# Patient Record
Sex: Female | Born: 1965 | Race: White | Hispanic: No | Marital: Married | Smoking: Never smoker
Health system: Southern US, Community
[De-identification: ages and names within clinical notes are randomized; demographics above are authoritative.]

## PROBLEM LIST (undated history)

## (undated) DIAGNOSIS — E669 Obesity, unspecified: Secondary | ICD-10-CM

## (undated) DIAGNOSIS — G039 Meningitis, unspecified: Secondary | ICD-10-CM

## (undated) DIAGNOSIS — I10 Essential (primary) hypertension: Secondary | ICD-10-CM

## (undated) DIAGNOSIS — R51 Headache: Secondary | ICD-10-CM

## (undated) DIAGNOSIS — E119 Type 2 diabetes mellitus without complications: Secondary | ICD-10-CM

## (undated) HISTORY — DX: Headache: R51

## (undated) HISTORY — DX: Obesity, unspecified: E66.9

---
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
## 1997-08-26 ENCOUNTER — Other Ambulatory Visit: Admission: RE | Admit: 1997-08-26 | Discharge: 1997-08-26 | Payer: Self-pay | Admitting: Obstetrics and Gynecology

## 1997-09-09 ENCOUNTER — Other Ambulatory Visit: Admission: RE | Admit: 1997-09-09 | Discharge: 1997-09-09 | Payer: Self-pay | Admitting: Obstetrics and Gynecology

## 1998-03-20 ENCOUNTER — Inpatient Hospital Stay (HOSPITAL_COMMUNITY): Admission: EM | Admit: 1998-03-20 | Discharge: 1998-03-28 | Payer: Self-pay | Admitting: Emergency Medicine

## 1998-03-25 ENCOUNTER — Encounter: Payer: Self-pay | Admitting: Family Medicine

## 1998-04-14 ENCOUNTER — Other Ambulatory Visit: Admission: RE | Admit: 1998-04-14 | Discharge: 1998-04-14 | Payer: Self-pay | Admitting: Obstetrics and Gynecology

## 1998-06-01 ENCOUNTER — Other Ambulatory Visit: Admission: RE | Admit: 1998-06-01 | Discharge: 1998-06-01 | Payer: Self-pay | Admitting: Obstetrics and Gynecology

## 1998-06-28 ENCOUNTER — Other Ambulatory Visit: Admission: RE | Admit: 1998-06-28 | Discharge: 1998-06-28 | Payer: Self-pay | Admitting: Obstetrics and Gynecology

## 1998-11-09 ENCOUNTER — Other Ambulatory Visit: Admission: RE | Admit: 1998-11-09 | Discharge: 1998-11-09 | Payer: Self-pay | Admitting: Obstetrics and Gynecology

## 2005-01-23 ENCOUNTER — Other Ambulatory Visit: Admission: RE | Admit: 2005-01-23 | Discharge: 2005-01-23 | Payer: Self-pay | Admitting: Obstetrics and Gynecology

## 2005-05-28 HISTORY — PX: ABDOMINAL HYSTERECTOMY: SHX81

## 2005-07-26 ENCOUNTER — Inpatient Hospital Stay (HOSPITAL_COMMUNITY): Admission: RE | Admit: 2005-07-26 | Discharge: 2005-07-29 | Payer: Self-pay | Admitting: Obstetrics and Gynecology

## 2005-09-26 ENCOUNTER — Encounter: Payer: Self-pay | Admitting: Emergency Medicine

## 2012-08-29 ENCOUNTER — Other Ambulatory Visit: Payer: Self-pay | Admitting: Internal Medicine

## 2012-08-29 DIAGNOSIS — R42 Dizziness and giddiness: Secondary | ICD-10-CM

## 2012-09-01 ENCOUNTER — Encounter (HOSPITAL_COMMUNITY): Payer: Self-pay | Admitting: *Deleted

## 2012-09-01 ENCOUNTER — Emergency Department (HOSPITAL_COMMUNITY)
Admission: EM | Admit: 2012-09-01 | Discharge: 2012-09-01 | Disposition: A | Discharge status: Home / Self Care | Payer: Private Health Insurance - Indemnity | Attending: Emergency Medicine | Admitting: Emergency Medicine

## 2012-09-01 ENCOUNTER — Ambulatory Visit
Admission: RE | Admit: 2012-09-01 | Discharge: 2012-09-01 | Disposition: A | Discharge status: Home / Self Care | Payer: Private Health Insurance - Indemnity | Source: Ambulatory Visit | Attending: Internal Medicine | Admitting: Internal Medicine

## 2012-09-01 DIAGNOSIS — I10 Essential (primary) hypertension: Secondary | ICD-10-CM | POA: Insufficient documentation

## 2012-09-01 DIAGNOSIS — K5900 Constipation, unspecified: Secondary | ICD-10-CM | POA: Insufficient documentation

## 2012-09-01 DIAGNOSIS — Z90710 Acquired absence of both cervix and uterus: Secondary | ICD-10-CM | POA: Insufficient documentation

## 2012-09-01 DIAGNOSIS — L299 Pruritus, unspecified: Secondary | ICD-10-CM | POA: Insufficient documentation

## 2012-09-01 DIAGNOSIS — R109 Unspecified abdominal pain: Secondary | ICD-10-CM | POA: Insufficient documentation

## 2012-09-01 DIAGNOSIS — E119 Type 2 diabetes mellitus without complications: Secondary | ICD-10-CM | POA: Insufficient documentation

## 2012-09-01 DIAGNOSIS — R110 Nausea: Secondary | ICD-10-CM | POA: Insufficient documentation

## 2012-09-01 DIAGNOSIS — R42 Dizziness and giddiness: Secondary | ICD-10-CM | POA: Insufficient documentation

## 2012-09-01 DIAGNOSIS — Z8661 Personal history of infections of the central nervous system: Secondary | ICD-10-CM | POA: Insufficient documentation

## 2012-09-01 DIAGNOSIS — Z79899 Other long term (current) drug therapy: Secondary | ICD-10-CM | POA: Insufficient documentation

## 2012-09-01 HISTORY — DX: Essential (primary) hypertension: I10

## 2012-09-01 HISTORY — DX: Type 2 diabetes mellitus without complications: E11.9

## 2012-09-01 HISTORY — DX: Meningitis, unspecified: G03.9

## 2012-09-01 LAB — COMPREHENSIVE METABOLIC PANEL
Albumin: 4.1 g/dL (ref 3.5–5.2)
Alkaline Phosphatase: 61 U/L (ref 39–117)
BUN: 11 mg/dL (ref 6–23)
Calcium: 9.6 mg/dL (ref 8.4–10.5)
Potassium: 4 mEq/L (ref 3.5–5.1)
Sodium: 142 mEq/L (ref 135–145)
Total Protein: 7.4 g/dL (ref 6.0–8.3)

## 2012-09-01 LAB — CBC
HCT: 45.2 % (ref 36.0–46.0)
MCHC: 35.4 g/dL (ref 30.0–36.0)
RDW: 14 % (ref 11.5–15.5)

## 2012-09-01 MED ORDER — SENNOSIDES 8.6 MG PO TABS: 2.0000 | ORAL_TABLET | Freq: Every day | ORAL | Status: DC

## 2012-09-01 MED ORDER — MECLIZINE HCL 25 MG PO TABS
25.0000 mg | ORAL_TABLET | Freq: Once | ORAL | Status: DC
  Filled 2012-09-01: qty 1

## 2012-09-01 NOTE — ED Provider Notes (Signed)
ECG shows normal sinus rhythm with a rate of 74, no ectopy. Normal axis. Normal P wave. Normal QRS. Normal intervals. Normal ST and T waves. Impression: normal ECG. No prior ECG available for comparison.  Medical screening examination/treatment/procedure(s) were performed by non-physician practitioner and as supervising physician I was immediately available for consultation/collaboration.   Dione Booze, MD 09/01/12 [phone]
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
## 2012-09-01 NOTE — ED Notes (Addendum)
Pt states that she is here because her pupils keep contracting and dilating and "pulsating". Pt states that she has a pain in her upper abdomen and it goes through to her back. Pt states dizziness as well. Pt very anxious and tearful. Pt suppose to have MRI of head and kidneys today due to dizziness by PCP.

## 2012-09-05 ENCOUNTER — Emergency Department: Payer: Self-pay | Admitting: Emergency Medicine

## 2012-09-05 LAB — CBC
HCT: 47.7 % — ABNORMAL HIGH (ref 35.0–47.0)
MCV: 84 fL (ref 80–100)
RBC: 5.69 10*6/uL — ABNORMAL HIGH (ref 3.80–5.20)
RDW: 14.3 % (ref 11.5–14.5)

## 2012-09-05 LAB — COMPREHENSIVE METABOLIC PANEL
Albumin: 4.2 g/dL (ref 3.4–5.0)
Alkaline Phosphatase: 69 U/L (ref 50–136)
Anion Gap: 8 (ref 7–16)
Bilirubin,Total: 0.6 mg/dL (ref 0.2–1.0)
Co2: 27 mmol/L (ref 21–32)
EGFR (African American): >60
EGFR (Non-African Amer.): >60
Sodium: 140 mmol/L (ref 136–145)
Total Protein: 7.7 g/dL (ref 6.4–8.2)

## 2012-09-05 LAB — URINALYSIS, COMPLETE
Bilirubin,UR: NEGATIVE
Glucose,UR: NEGATIVE mg/dL (ref 0–75)
Leukocyte Esterase: NEGATIVE
Nitrite: NEGATIVE
Ph: 5 (ref 4.5–8.0)
Specific Gravity: 1.014 (ref 1.003–1.030)
Squamous Epithelial: 1

## 2012-09-15 ENCOUNTER — Institutional Professional Consult (permissible substitution): Payer: Private Health Insurance - Indemnity | Admitting: Internal Medicine

## 2012-09-16 ENCOUNTER — Institutional Professional Consult (permissible substitution): Payer: Private Health Insurance - Indemnity | Admitting: Internal Medicine

## 2012-09-18 ENCOUNTER — Encounter: Payer: Self-pay | Admitting: Neurology

## 2012-09-18 ENCOUNTER — Ambulatory Visit (INDEPENDENT_AMBULATORY_CARE_PROVIDER_SITE_OTHER): Payer: Private Health Insurance - Indemnity | Admitting: Neurology

## 2012-09-18 VITALS — BP 155/93 | HR 76 | Ht 65.25 in | Wt 263.0 lb

## 2012-09-18 DIAGNOSIS — G43019 Migraine without aura, intractable, without status migrainosus: Secondary | ICD-10-CM | POA: Insufficient documentation

## 2012-09-18 MED ORDER — SUMATRIPTAN SUCCINATE 100 MG PO TABS: 100.0000 mg | ORAL_TABLET | Freq: Two times a day (BID) | ORAL | Status: DC | PRN

## 2012-09-18 MED ORDER — TOPIRAMATE 25 MG PO TABS: ORAL_TABLET | ORAL | Status: DC

## 2012-09-18 NOTE — Progress Notes (Signed)
Reason for visit: Headache  Patricia Schmitt is a 47 y.o. female  History of present illness:  Patricia Schmitt is a 47 year old right-handed white female with a history of obesity and a prior history of migraine headache. The patient generally will have headaches twice a month, but within the last 6 weeks, the patient had unusual episodes associated with headache. The patient will have a sensation of flushing coming up from the low back to the back of the neck and over the head. The patient will then developed a throbbing headache in the back of the head, associated with some neck stiffness. The patient will have dizziness, vertigo, and oscillopsia. The patient may have some nausea with the events, no vomiting. The patient will develop photophobia and phonophobia, and she will see spots in front of the eyes with blurred vision. The patient indicates that the episodes may last anywhere from 20 minutes to several hours. The patient may be wiped out afterwards. The patient has also developed constipation which is unusual for her. The patient has undergone MRI evaluation the brain that is normal. The patient denies any problems controlling the bladder, and she denies any blackouts, or any weakness of the extremities. The patient may have some tingling sensations in the toes and down the arms. The patient is sent to this office for an evaluation.  Past Medical History  Diagnosis Date  . Meningitis spinal   . Hypertension   . Headache     Migraine  . Obesity   . Diabetes mellitus without complication     Prediabetes    Past Surgical History  Procedure Laterality Date  . Abdominal hysterectomy    . Tonsillectomy      History reviewed. No pertinent family history.  Social history:  reports that she has never smoked. She does not have any smokeless tobacco history on file. She reports that  drinks alcohol. She reports that she does not use illicit drugs.  Medications:  Current Outpatient  Prescriptions on File Prior to Visit  Medication Sig Dispense Refill  . glycerin adult (GLYCERIN ADULT) 2 G SUPP Place 1 suppository rectally once.      . senna (SENOKOT TO GO) 8.6 MG tablet Take 2 tablets (17.2 mg total) by mouth daily.  15 tablet  1   No current facility-administered medications on file prior to visit.    Allergies:  Allergies  Allergen Reactions  . Ace Inhibitors Other (See Comments)    Dizziness, balance issues  . Amoxicillin Other (See Comments)    Stomach cramps  . Erythromycin     Stomach cramps  . Ivp Dye (Iodinated Diagnostic Agents)     Nausea vomiting  . Penicillins Nausea And Vomiting    ROS:  Out of a complete 14 system review of symptoms, the patient complains only of the following symptoms, and all other reviewed systems are negative.  Fevers, chills, fatigue Ringing in the ears, vertigo Itching sensations Blurred vision, shortness of breath, cough Constipation Easy bruising Feeling cold, flushing Joint pain, achy muscles Memory disturbance, confusion, headache, numbness Tremor, anxiety, change in appetite, insomnia  Blood pressure 155/93, pulse 76, height 5' 5.25" (1.657 m), weight 263 lb (119.296 kg).  Physical Exam  General: The patient is alert and cooperative at the time of the examination. The patient is markedly obese.  Head: Pupils are equal, round, and reactive to light. Discs are flat bilaterally.  Neck: The neck is supple, no carotid bruits are noted.  Respiratory: The respiratory  examination is clear.  Cardiovascular: The cardiovascular examination reveals a regular rate and rhythm, no obvious murmurs or rubs are noted.  Neuromuscular: The patient has good range of movement of the cervical spine. No crepitus is noted in the temporomandibular joints.  Skin: Extremities are without significant edema.  Neurologic Exam  Mental status:  Cranial nerves: Facial symmetry is present. There is good sensation of the face to  pinprick and soft touch bilaterally. The strength of the facial muscles and the muscles to head turning and shoulder shrug are normal bilaterally. Speech is well enunciated, no aphasia or dysarthria is noted. Extraocular movements are full. Visual fields are full.  Motor: The motor testing reveals 5 over 5 strength of all 4 extremities. Good symmetric motor tone is noted throughout.  Sensory: Sensory testing is intact to pinprick, soft touch, vibration sensation, and position sense on all 4 extremities. No evidence of extinction is noted.  Coordination: Cerebellar testing reveals good finger-nose-finger and heel-to-shin bilaterally.  Gait and station: Gait is normal. Tandem gait is normal. Romberg is negative. No drift is seen.  Reflexes: Deep tendon reflexes are symmetric and normal bilaterally. Toes are downgoing bilaterally.   Assessment/Plan:  1. Episodic headache, dizziness, tingling sensations  The patient likely has migraine-type phenomenon. The patient has several characteristic features of migraine associated with throbbing headache, photophobia, phonophobia, scalp tenderness, vertigo, and nausea associated with headache and neck stiffness. The patient had a MRI the brain that was unremarkable. The patient will be placed on Topamax, and she will be worked up to a 75 mg at night dose. The patient was given Imitrex to take if needed. The patient will followup through this office in about 3 months.  Marlan Palau MD 09/18/2012 8:14 PM  [facility] [address] [place], [place] [address]  Phone [phone] Fax [phone]

## 2012-09-22 ENCOUNTER — Ambulatory Visit: Payer: Private Health Insurance - Indemnity | Admitting: Neurology

## 2012-09-29 ENCOUNTER — Encounter: Payer: Self-pay | Admitting: Internal Medicine

## 2012-09-29 ENCOUNTER — Ambulatory Visit (INDEPENDENT_AMBULATORY_CARE_PROVIDER_SITE_OTHER): Payer: Private Health Insurance - Indemnity | Admitting: Internal Medicine

## 2012-09-29 VITALS — BP 130/90 | HR 86 | Temp 98.0°F | Ht 65.0 in | Wt 259.8 lb

## 2012-09-29 DIAGNOSIS — R0600 Dyspnea, unspecified: Secondary | ICD-10-CM

## 2012-09-29 DIAGNOSIS — R0609 Other forms of dyspnea: Secondary | ICD-10-CM

## 2012-09-29 DIAGNOSIS — R109 Unspecified abdominal pain: Secondary | ICD-10-CM | POA: Insufficient documentation

## 2012-09-29 NOTE — Progress Notes (Signed)
  Subjective:    Patient ID: Patricia Schmitt, female    DOB: 02-12-1966   MRN: [id number]  HPI  64 yowf never smoker works in [employer] with progressive wt gain starting around 2000 with hbp and aodm self referred for new onset "spells"   09/29/2012 1st pulmonary eval cc new onset waking up feeling chilled nervous tachycardia and ? [place] with bs 124 fasting on glucophage and nauseated all resolved w/in an hour without eats 6 weeks prior to OV  Recurred sev times a day then went off glucophage 4 weeks prior to OV  Then to ER with same symptoms which have reduced some in frequency and feels better after labs drawn then 3 weeks prior to OV separate from the above sob at rest, lasts up to 15 min and better after walking and deep breathing.  No effect on activity tolerance but not aerobically active and never occur sleeping.   No obvious daytime variabilty or assoc chronic cough or cp or chest tightness, subjective wheeze overt sinus or hb symptoms. No unusual exp hx or h/o childhood pna/ asthma or premature birth to her knowledge.   Sleeping ok without nocturnal  or early am exacerbation  of respiratory  c/o's or need for noct saba. Also denies any obvious fluctuation of symptoms with weather or environmental changes or other aggravating or alleviating factors except as outlined above   Review of Systems  Constitutional: Negative for fever, chills and unexpected weight change.  HENT: Negative for ear pain, nosebleeds, congestion, sore throat, rhinorrhea, sneezing, trouble swallowing, dental problem, voice change, postnasal drip and sinus pressure.   Eyes: Negative for visual disturbance.  Respiratory: Positive for shortness of breath. Negative for cough and choking.   Cardiovascular: Negative for chest pain and leg swelling.  Gastrointestinal: Positive for abdominal pain. Negative for vomiting and diarrhea.  Genitourinary: Negative for difficulty urinating.  Musculoskeletal: Negative  for arthralgias.  Skin: Negative for rash.  Neurological: Positive for headaches. Negative for tremors and syncope.  Hematological: Does not bruise/bleed easily.       Objective:   Physical Exam  amb ant wf  Wt Readings from Last 3 Encounters:  09/29/12 259 lb 12.8 oz (117.845 kg)  09/18/12 263 lb (119.296 kg)   HEENT: nl dentition, turbinates, and orophanx. Nl external ear canals without cough reflex   NECK :  without JVD/Nodes/TM/ nl carotid upstrokes bilaterally   LUNGS: no acc muscle use, clear to A and P bilaterally without cough on insp or exp maneuvers   CV:  RRR  no s3 or murmur or increase in P2, no edema   ABD:  soft and nontender with nl excursion in the supine position. No bruits or organomegaly, bowel sounds nl  MS:  warm without deformities, calf tenderness, cyanosis or clubbing  SKIN: warm and dry without lesions    NEURO:  alert, approp, no deficits       Assessment & Plan:

## 2012-09-29 NOTE — Patient Instructions (Addendum)
Classic subdiaphragmatic pain pattern suggests ibs:  Stereotypical, migratory with a very limited distribution of pain locations, daytime, not exacerbated by ex or coughing, worse in sitting position, associated with generalized abd bloating, not present supine due to the dome effect of the diaphragm is  canceled in that position. Frequently these patients have had multiple negative GI workups and CT scans.  Treatment consists of avoiding foods that cause gas (especially beans and raw vegetables like spinach and salads)  and citrucel 1 heaping tsp twice daily with a large glass of water.  Pain should improve w/in 2 weeks and if not then consider further GI work up.     Maalox plus or Mylanta II are also effective as needed  The follow up on your nodule is one year optional.

## 2012-09-30 ENCOUNTER — Telehealth: Payer: Self-pay | Admitting: Internal Medicine

## 2012-09-30 ENCOUNTER — Telehealth: Payer: Self-pay | Admitting: Gastroenterology

## 2012-09-30 DIAGNOSIS — R0600 Dyspnea, unspecified: Secondary | ICD-10-CM | POA: Insufficient documentation

## 2012-09-30 DIAGNOSIS — R109 Unspecified abdominal pain: Secondary | ICD-10-CM

## 2012-09-30 NOTE — Telephone Encounter (Signed)
Almyra Free is given an appt for the patient for tomorrow with Mike Gip PA

## 2012-09-30 NOTE — Telephone Encounter (Signed)
I have left a message for [facility]

## 2012-09-30 NOTE — Assessment & Plan Note (Signed)
Symptoms are markedly disproportionate to objective findings and not clear this is a lung problem  - the pattern is typical of hyperventilation syndrome.  Reassured if the symptoms are not reproducible with exertion and resolve spontaneously assoc with abd complaints it may well be she senses abd gas which reduces abd compliance noting it also never happens in supine position where intestinal gas buildup is not as likely.

## 2012-09-30 NOTE — Telephone Encounter (Signed)
Order placed patient is aware. Patient states she did try Maalox Plus--however this did not work. Nothing further needed at this time

## 2012-09-30 NOTE — Telephone Encounter (Signed)
Spoke with patient, Patient was seen by Dr. Sherene Sires 09/29/12-- She states she did not sleep well last night-- sob, upper abd pain that radiated to back Today she still feels the same, sx accompanied with more back soreness when eating, nausea,dixxiness and tingling in her hands, feet and arms. Patient is requesting GI referral now, states she can not wait 2 weeks Dr. Sherene Sires please advise if this is ok to go ahead and order. Thank you

## 2012-09-30 NOTE — Assessment & Plan Note (Signed)

## 2012-09-30 NOTE — Telephone Encounter (Signed)
Fine me  Dx abd pain Make sure she tries the prn maalox plus first

## 2012-10-01 ENCOUNTER — Ambulatory Visit (INDEPENDENT_AMBULATORY_CARE_PROVIDER_SITE_OTHER): Payer: Private Health Insurance - Indemnity | Admitting: Physician Assistant

## 2012-10-01 ENCOUNTER — Ambulatory Visit (HOSPITAL_COMMUNITY)
Admission: RE | Admit: 2012-10-01 | Discharge: 2012-10-01 | Disposition: A | Discharge status: Home / Self Care | Payer: Private Health Insurance - Indemnity | Source: Ambulatory Visit | Attending: Physician Assistant | Admitting: Physician Assistant

## 2012-10-01 ENCOUNTER — Other Ambulatory Visit: Payer: Private Health Insurance - Indemnity

## 2012-10-01 ENCOUNTER — Encounter: Payer: Self-pay | Admitting: Physician Assistant

## 2012-10-01 ENCOUNTER — Other Ambulatory Visit (INDEPENDENT_AMBULATORY_CARE_PROVIDER_SITE_OTHER): Payer: Private Health Insurance - Indemnity

## 2012-10-01 VITALS — BP 138/90 | HR 78 | Ht 64.0 in | Wt 256.0 lb

## 2012-10-01 DIAGNOSIS — G8929 Other chronic pain: Secondary | ICD-10-CM

## 2012-10-01 DIAGNOSIS — R198 Other specified symptoms and signs involving the digestive system and abdomen: Secondary | ICD-10-CM

## 2012-10-01 DIAGNOSIS — K5900 Constipation, unspecified: Secondary | ICD-10-CM

## 2012-10-01 DIAGNOSIS — R194 Change in bowel habit: Secondary | ICD-10-CM

## 2012-10-01 DIAGNOSIS — K921 Melena: Secondary | ICD-10-CM

## 2012-10-01 DIAGNOSIS — R1011 Right upper quadrant pain: Secondary | ICD-10-CM

## 2012-10-01 MED ORDER — OMEPRAZOLE 20 MG PO CPDR: DELAYED_RELEASE_CAPSULE | ORAL | Status: DC

## 2012-10-01 MED ORDER — MOVIPREP 100 G PO SOLR: 1.0000 | Freq: Once | ORAL | Status: DC

## 2012-10-01 NOTE — Progress Notes (Addendum)
Subjective:    Patient ID: Patricia Schmitt, female    DOB: 08-26-1965, 47 y.o.   MRN: [id number]  HPI  Patricia Schmitt is a pleasant 47 year old white female new to GI today. She has seen Dr. Sherene Sires for primary care and is referred for evaluation of abdominal pain. She has not had any prior GI evaluation She says she has been having her current symptoms for 6 or 7 weeks and complains of severe pain in her epigastrium radiating around bilaterally into her back. She describes a burning pain in her back and a sharp pain in her upper abdomen which is worse with any by mouth intake and lasts for one to one and a half hours .Marland Kitchen She is also been having intermittent nausea. She says she has no appetite but has been pushing herself to eat to keep her energy up. Her weight has been stable. She also has a sour bitter taste in her mouth and feels that her tongue is coated. She has also developed new onset of constipation which she has never had in the past and this has been present over the past few months. Says she's now going as long as 7 or 8 days between bowel movements. She has seen small amounts of bright red blood per and generally when she is constipated. She tried a bottle of mag citrate at 1 point which did help the constipation however made her fell portable overall. His not using any other regular laxatives. She had been taking metformin for prediabetes and Diovan and stopped both of these about 40 days ago because of onset of the above symptoms. She was not sure whether or not she was having side effects from these meds and stopped him but says that she does not feel any different. She's not using any regular aspirin or NSAIDs. Family history is positive for gallbladder disease in 2 sisters. Patient had a CT scan of the abdomen and pelvis done on 09/08/2012 without IV contrast and this showed a 4 mm nodule at the right lung base and a 2.2 cm hypodensity adjacent to the falciform ligament likely focal fatty infiltration  otherwise negative scan. She had also had a CBC with differential and seem that done in April both of which were normal. Her hemoglobin was actually high at 16. Patient is very concerned about her symptoms and states that she knows something is wrong in that she has never felt this poorly in the past.    Review of Systems  Constitutional: Positive for activity change, appetite change and fatigue.  HENT: Negative.   Eyes: Negative.   Respiratory: Negative.   Cardiovascular: Negative.   Gastrointestinal: Positive for nausea, abdominal pain, constipation and anal bleeding.  Endocrine: Negative.   Genitourinary: Negative.   Allergic/Immunologic: Negative.   Neurological: Negative.   Hematological: Negative.   Psychiatric/Behavioral: Negative.    No outpatient prescriptions prior to visit.   No facility-administered medications prior to visit.       Allergies  Allergen Reactions  . Ace Inhibitors Other (See Comments)    Dizziness, balance issues  . Amoxicillin Other (See Comments)    Stomach cramps  . Erythromycin     Stomach cramps  . Ivp Dye (Iodinated Diagnostic Agents)     Nausea vomiting  . Penicillins Nausea And Vomiting   Patient Active Problem List   Diagnosis Date Noted  . Dyspnea 09/30/2012  . Abdominal pain, unspecified site 09/29/2012  . Migraine without aura, with intractable migraine, so stated, without mention  of status migrainosus 09/18/2012   History  Substance Use Topics  . Smoking status: Never Smoker   . Smokeless tobacco: Never Used  . Alcohol Use: Yes     Comment: Drink 1-2 times per year for holidays    Objective:   Physical Exam  well-developed white female in no acute distress, pleasant blood pressure 138/90 pulse 78 height 5 foot 4 weight 256. HEENT; nontraumatic normocephalic EOMI PERRLA sclera anicteric, Supple; no JVD, Cardiovascular; regular rate and rhythm with S1-S2 no murmur or gallop, Pulmonary; clear bilaterally, Abdomen; large soft  bowel sounds are active she is tender in the epigastrium right upper quadrant and right mid quadrant there is no guarding or rebound no palpable mass or hepatosplenomegaly, Rectal; exam small external hemorrhoidal tag stool is yellow and Hemoccult negative, Extremities; no clubbing cyanosis or edema skin warm and dry, Psych; mood and affect appropriate.        Assessment & Plan:  #67 47 year old female with 6-7 week history of epigastric pain radiating to her back and new onset constipation. Patient has had associated fatigue anorexia and intermittent small-volume hematochezia. Etiology of her symptoms is not clear. We'll workup further to rule out gallbladder disease also consider gastropathy or peptic ulcer disease or underlying IBD.   Plan; start empiric Prilosec 20 mg by mouth every morning, samples were given today to see if this would help with the epigastric pain Start MiraLax 17 g daily in 8 ounces of water or juice Schedule for upper abdominal ultrasound Schedule for colonoscopy and EGD with Dr. Pyrtle-procedures were discussed in detail with the patient and she is agreeable to proceed. We'll check CRP and ANA today patient states she had recent thyroid studies done which were normal.  Addendum: Reviewed and agree with initial management. Beverley Fiedler, MD

## 2012-10-01 NOTE — Patient Instructions (Addendum)
Go directly to [facility] outpatient registration, front door, go to Radiology, 1st floor. Come to our lab basement level today for labs.  We sent the prescription for the prep for the colonoscopy to [facility], [place].  We have given you samples of Prilosec and Miralax. Take the Miralax 1 dose daily.   You can get that at any pharmacy, [facility], [facility], [facility]. You have been scheduled for an endoscopy and colonoscopy with propofol. Please follow the written instructions given to you at your visit today. Please pick up your prep at the pharmacy within the next 1-3 days. If you use inhalers (even only as needed), please bring them with you on the day of your procedure. Your physician has requested that you go to www.startemmi.com and enter the access code given to you at your visit today. This web site gives a general overview about your procedure. However, you should still follow specific instructions given to you by our office regarding your preparation for the procedure.

## 2012-10-02 ENCOUNTER — Telehealth: Payer: Self-pay | Admitting: Physician Assistant

## 2012-10-02 ENCOUNTER — Encounter: Payer: Self-pay | Admitting: Internal Medicine

## 2012-10-02 LAB — ANA: Anti Nuclear Antibody(ANA): NEGATIVE

## 2012-10-02 NOTE — Telephone Encounter (Signed)
Scheduled with Dr. Rhea Belton on 10/14/12 at 9:00 AM. She will call [facility] for appointment.

## 2012-10-02 NOTE — Telephone Encounter (Signed)
She needs to follow up with Pyrtle in a few weeks if cancelling procedures. Can be referred to [employer] [facility], probably can call and make her own appt

## 2012-10-02 NOTE — Telephone Encounter (Signed)
Patient wants to wait and have EC[facility] at a later date. Patient is asking if Mike Gip, PA would refer her to ortho for possible problems in back. She states she cracked her neck and had some strange feeling. Please, advise.

## 2012-10-06 ENCOUNTER — Encounter: Payer: Private Health Insurance - Indemnity | Admitting: Internal Medicine

## 2012-10-10 ENCOUNTER — Encounter: Payer: Self-pay | Admitting: Internal Medicine

## 2012-10-14 ENCOUNTER — Ambulatory Visit: Payer: Private Health Insurance - Indemnity | Admitting: Internal Medicine

## 2012-10-28 ENCOUNTER — Encounter: Payer: Self-pay | Admitting: Family Medicine

## 2012-10-28 ENCOUNTER — Ambulatory Visit (INDEPENDENT_AMBULATORY_CARE_PROVIDER_SITE_OTHER): Payer: Private Health Insurance - Indemnity | Admitting: Family Medicine

## 2012-10-28 VITALS — BP 96/66 | HR 77 | Temp 98.7°F | Ht 64.25 in | Wt 252.4 lb

## 2012-10-28 DIAGNOSIS — T383X5A Adverse effect of insulin and oral hypoglycemic [antidiabetic] drugs, initial encounter: Secondary | ICD-10-CM

## 2012-10-28 DIAGNOSIS — R002 Palpitations: Secondary | ICD-10-CM

## 2012-10-28 DIAGNOSIS — E118 Type 2 diabetes mellitus with unspecified complications: Secondary | ICD-10-CM

## 2012-10-28 DIAGNOSIS — R42 Dizziness and giddiness: Secondary | ICD-10-CM

## 2012-10-28 DIAGNOSIS — R109 Unspecified abdominal pain: Secondary | ICD-10-CM

## 2012-10-28 DIAGNOSIS — R7982 Elevated C-reactive protein (CRP): Secondary | ICD-10-CM

## 2012-10-28 DIAGNOSIS — E1165 Type 2 diabetes mellitus with hyperglycemia: Secondary | ICD-10-CM

## 2012-10-28 LAB — LIPID PANEL
HDL: 51.8 mg/dL (ref >39.00)
Total CHOL/HDL Ratio: 4
Triglycerides: 83 mg/dL (ref 0.0–149.0)
VLDL: 16.6 mg/dL (ref 0.0–40.0)

## 2012-10-28 LAB — HEPATIC FUNCTION PANEL
ALT: 23 U/L (ref 0–35)
AST: 20 U/L (ref 0–37)
Albumin: 4.3 g/dL (ref 3.5–5.2)
Alkaline Phosphatase: 57 U/L (ref 39–117)
Total Protein: 7.8 g/dL (ref 6.0–8.3)

## 2012-10-28 LAB — CBC WITH DIFFERENTIAL/PLATELET
Basophils Relative: 0.6 % (ref 0.0–3.0)
Eosinophils Relative: 0.8 % (ref 0.0–5.0)
HCT: 47.1 % — ABNORMAL HIGH (ref 36.0–46.0)
Hemoglobin: 15.8 g/dL — ABNORMAL HIGH (ref 12.0–15.0)
Lymphs Abs: 2.1 10*3/uL (ref 0.7–4.0)
Monocytes Relative: 6.5 % (ref 3.0–12.0)
Neutro Abs: 5.6 10*3/uL (ref 1.4–7.7)
RBC: 5.57 Mil/uL — ABNORMAL HIGH (ref 3.87–5.11)
RDW: 14.6 % (ref 11.5–14.6)
WBC: 8.4 10*3/uL (ref 4.5–10.5)

## 2012-10-28 LAB — TSH: TSH: 1.33 u[IU]/mL (ref 0.35–5.50)

## 2012-10-28 LAB — BASIC METABOLIC PANEL
GFR: 99.94 mL/min (ref >60.00)
Glucose, Bld: 90 mg/dL (ref 70–99)
Potassium: 4.1 mEq/L (ref 3.5–5.1)
Sodium: 141 mEq/L (ref 135–145)

## 2012-10-28 NOTE — Patient Instructions (Addendum)

## 2012-10-30 NOTE — Progress Notes (Signed)
  Subjective:     Patricia Schmitt is a 47 y.o. female who presents for evaluation of dizziness. The symptoms started several weeks ago and are worse. The attacks occur daily and last most  of the day. Positions that worsen symptoms: any motion. Previous workup/treatments: blood work: done by previous dr and CT scan of abd/pelvis and MRI head.  Associated ear symptoms: none. Associated CNS symptoms: drowsiness. Recent infections: none. Head trauma: denied. Drug ingestion: none. Noise exposure: no occupational exposure. Family history: non-contributory.  The following portions of the patient's history were reviewed and updated as appropriate: allergies, current medications, past family history, past medical history, past social history, past surgical history and problem list.  Review of Systems Pertinent items are noted in HPI.    Objective:    BP 96/66  Pulse 77  Temp(Src) 98.7 F (37.1 C) (Oral)  Ht 5' 4.25" (1.632 m)  Wt 252 lb 6.4 oz (114.488 kg)  BMI 42.99 kg/m2  SpO2 97% General appearance: alert, cooperative, appears stated age and no distress Eyes: conjunctivae/corneas clear. PERRL, EOM's intact. Fundi benign. Ears: normal TM's and external ear canals both ears Nose: Nares normal. Septum midline. Mucosa normal. No drainage or sinus tenderness. Throat: lips, mucosa, and tongue normal; teeth and gums normal Neck: no adenopathy, no carotid bruit, no JVD, supple, symmetrical, trachea midline and thyroid not enlarged, symmetric, no tenderness/mass/nodules Lungs: clear to auscultation bilaterally Heart: S1, S2 normal Abdomen: soft, non-tender; bowel sounds normal; no masses,  no organomegaly Extremities: extremities normal, atraumatic, no cyanosis or edema      Assessment:    Vertigo   GERD--omeprazole Plan:    Educational materials given and questions answered. Follow up in 2 weeks. check labs  l

## 2012-10-30 NOTE — Assessment & Plan Note (Signed)
See meds and orders 

## 2012-10-31 ENCOUNTER — Encounter: Payer: Self-pay | Admitting: Family Medicine

## 2012-10-31 ENCOUNTER — Other Ambulatory Visit: Payer: Self-pay | Admitting: Family Medicine

## 2012-10-31 DIAGNOSIS — R7982 Elevated C-reactive protein (CRP): Secondary | ICD-10-CM

## 2012-10-31 LAB — HIGH SENSITIVITY CRP: CRP, High Sensitivity: 9.88 mg/L — ABNORMAL HIGH (ref 0.000–5.000)

## 2012-10-31 NOTE — Addendum Note (Signed)
Addended by: Silvio Pate D on: 10/31/2012 10:51 AM   Modules accepted: Orders

## 2012-11-01 LAB — LACTIC ACID, PLASMA: LACTIC ACID: 0.8 mmol/L (ref 0.5–2.2)

## 2012-11-04 ENCOUNTER — Ambulatory Visit (INDEPENDENT_AMBULATORY_CARE_PROVIDER_SITE_OTHER): Payer: Private Health Insurance - Indemnity | Admitting: Family Medicine

## 2012-11-04 ENCOUNTER — Encounter: Payer: Self-pay | Admitting: Family Medicine

## 2012-11-04 VITALS — BP 136/90 | HR 77 | Temp 98.6°F | Wt 249.6 lb

## 2012-11-04 DIAGNOSIS — R002 Palpitations: Secondary | ICD-10-CM

## 2012-11-04 DIAGNOSIS — R109 Unspecified abdominal pain: Secondary | ICD-10-CM

## 2012-11-04 DIAGNOSIS — R202 Paresthesia of skin: Secondary | ICD-10-CM

## 2012-11-04 DIAGNOSIS — E119 Type 2 diabetes mellitus without complications: Secondary | ICD-10-CM

## 2012-11-04 DIAGNOSIS — L988 Other specified disorders of the skin and subcutaneous tissue: Secondary | ICD-10-CM

## 2012-11-04 DIAGNOSIS — R42 Dizziness and giddiness: Secondary | ICD-10-CM

## 2012-11-04 DIAGNOSIS — R238 Other skin changes: Secondary | ICD-10-CM | POA: Insufficient documentation

## 2012-11-04 DIAGNOSIS — R209 Unspecified disturbances of skin sensation: Secondary | ICD-10-CM

## 2012-11-04 DIAGNOSIS — M542 Cervicalgia: Secondary | ICD-10-CM

## 2012-11-04 DIAGNOSIS — T148 Other injury of unspecified body region: Secondary | ICD-10-CM

## 2012-11-04 DIAGNOSIS — W57XXXA Bitten or stung by nonvenomous insect and other nonvenomous arthropods, initial encounter: Secondary | ICD-10-CM

## 2012-11-04 NOTE — Assessment & Plan Note (Signed)
With dizziness and bp fluc---- redo 24 h urine--- protein done but not cate, meta

## 2012-11-04 NOTE — Assessment & Plan Note (Signed)
Labs reviewed.

## 2012-11-04 NOTE — Assessment & Plan Note (Signed)
Pt to con't f/u with GI

## 2012-11-04 NOTE — Assessment & Plan Note (Signed)
Pt unsure if she was bit by anything or not--- but with vague symptoms will check RMSF and lyme

## 2012-11-04 NOTE — Progress Notes (Signed)
  Subjective:    Patient ID: Patricia Schmitt, female    DOB: 12/08/1965, 47 y.o.   MRN: [id number]  HPI pt here c/o feeling dehydrated.Marland Kitchen No NVD.  Pt just doesn't feel right.  She is fatigued.  She does feel better.  24 h urine needs to be redone because there was not enough.    Review of Systems As above    Objective:   Physical Exam BP 136/90  Pulse 77  Temp(Src) 98.6 F (37 C) (Oral)  Wt 249 lb 9.6 oz (113.218 kg)  BMI 42.51 kg/m2  SpO2 98% General appearance: alert, cooperative, appears stated age and no distress Skin: few papules on arms,  no infection       Assessment & Plan:

## 2012-11-10 ENCOUNTER — Encounter: Payer: Self-pay | Admitting: Family Medicine

## 2012-11-26 ENCOUNTER — Ambulatory Visit: Payer: Private Health Insurance - Indemnity | Admitting: Neurology

## 2012-12-09 ENCOUNTER — Ambulatory Visit: Payer: Private Health Insurance - Indemnity | Admitting: Nurse Practitioner

## 2013-04-02 ENCOUNTER — Other Ambulatory Visit: Payer: Self-pay

## 2014-03-12 ENCOUNTER — Other Ambulatory Visit: Payer: Self-pay

## 2015-02-14 IMAGING — US US ABDOMEN COMPLETE
1 series · 14 of 25 positions shown · non-contrast
Comparison: None.

CLINICAL DATA: Abdominal pain, right upper quadrant

COMPLETE ABDOMINAL ULTRASOUND

[Series 1: us abdomen complete · 0.34mm/px · 14 of 79 slices shown]
[im 1/79]
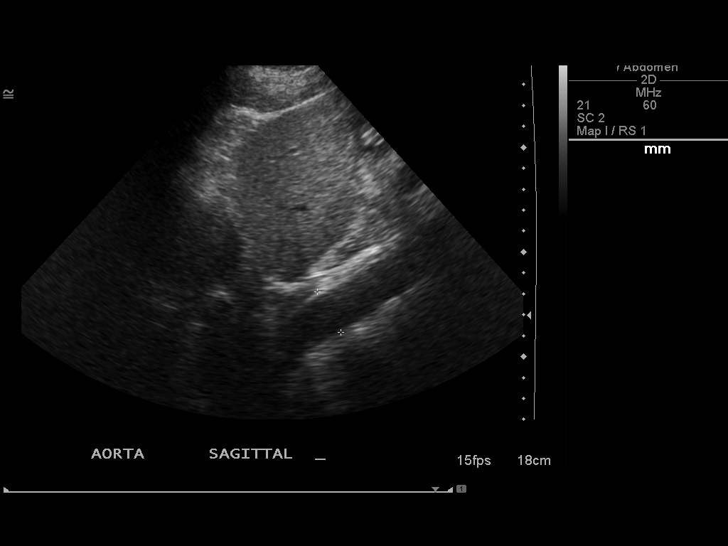
[im 7/79]
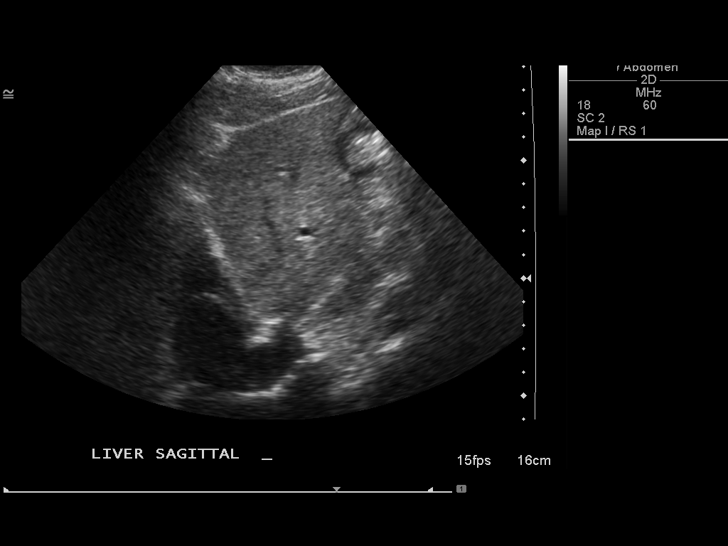
[im 14/79]
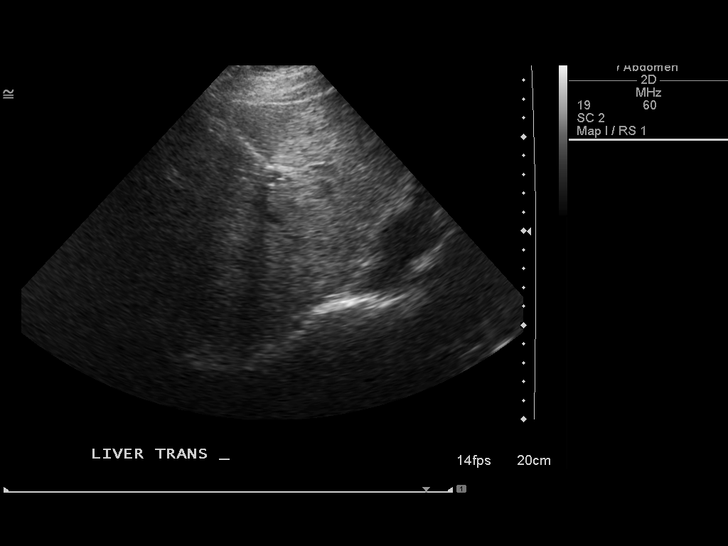
[im 20/79]
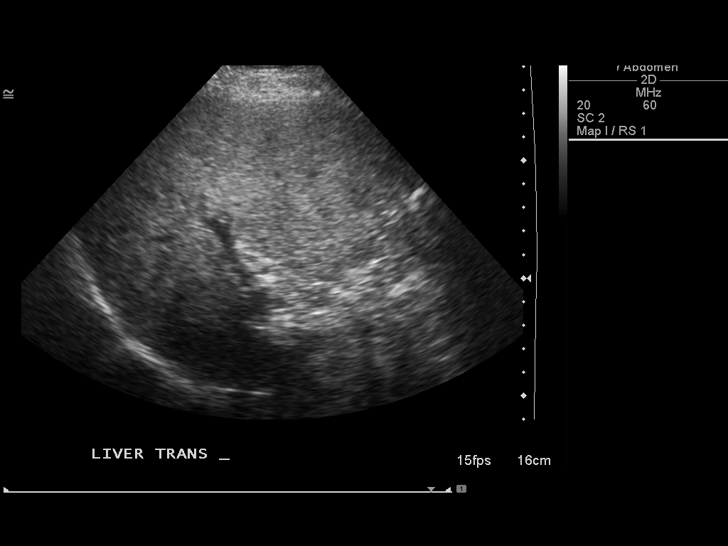
[im 27/79]
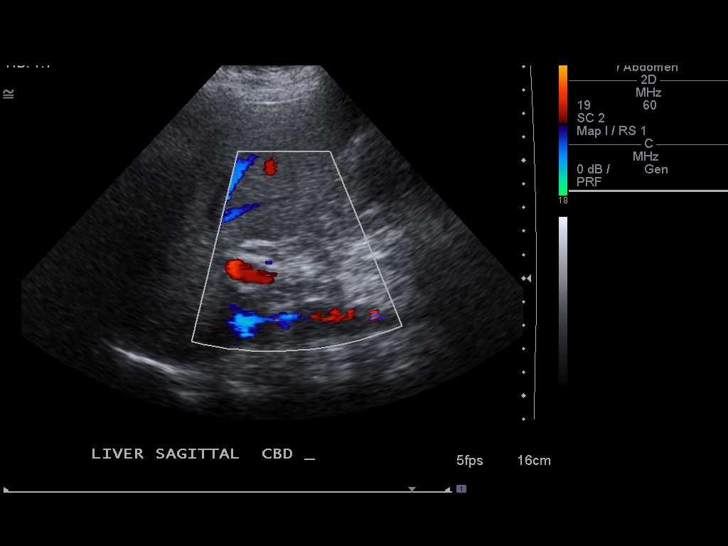
[im 30/79]
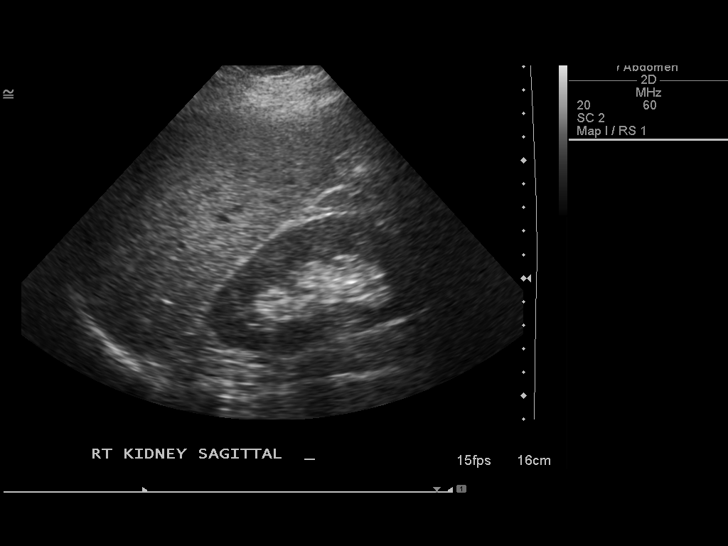
[im 36/79]
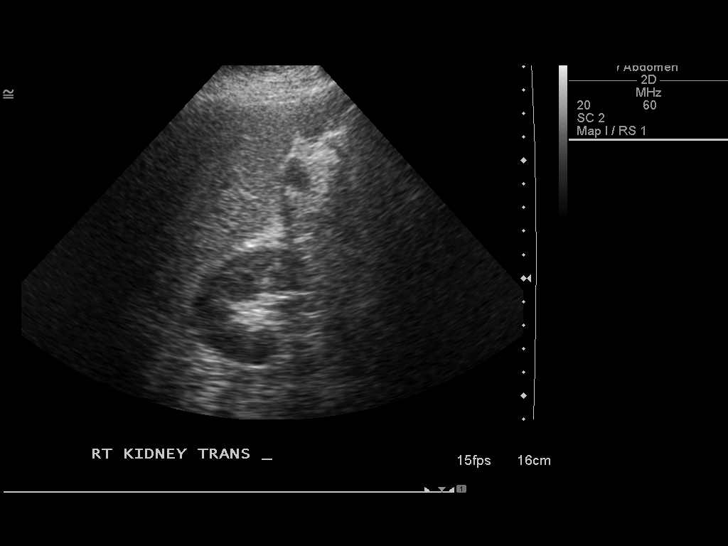
[im 43/79]
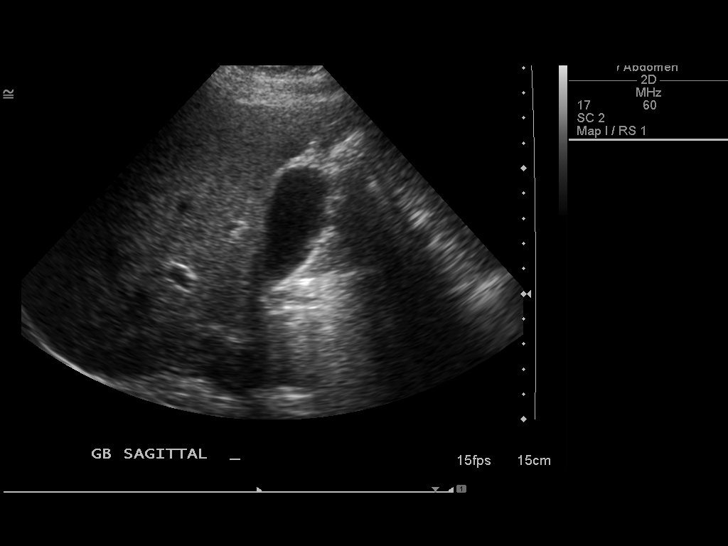
[im 49/79]
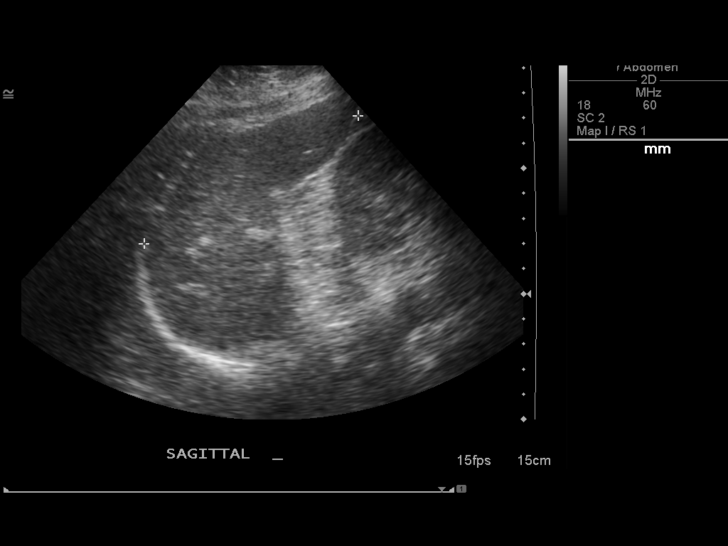
[im 53/79]
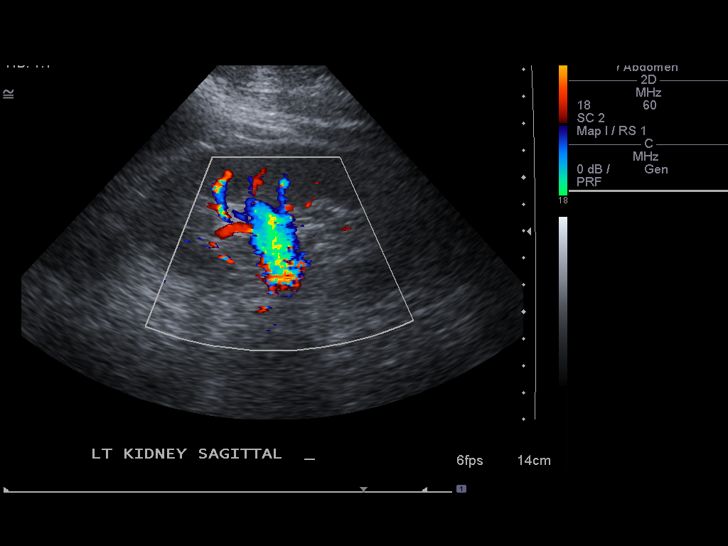
[im 59/79]
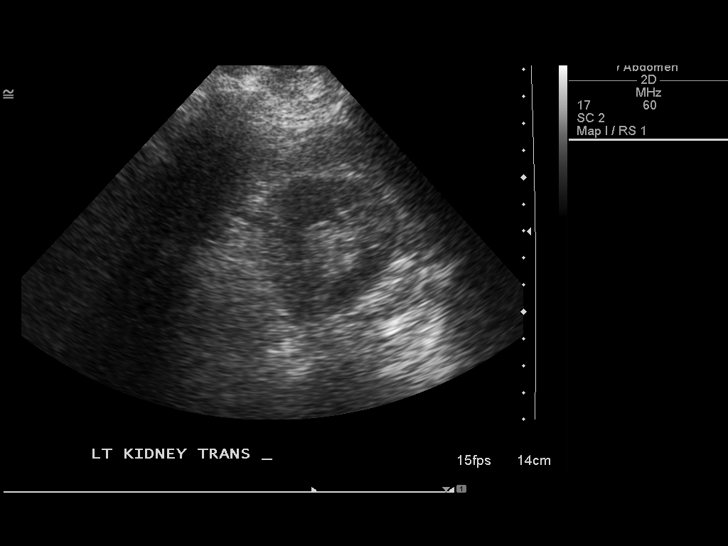
[im 66/79]
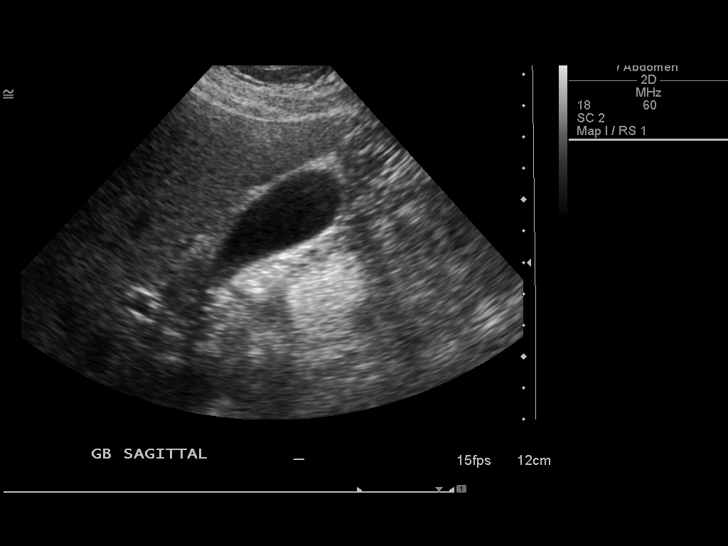
[im 72/79]
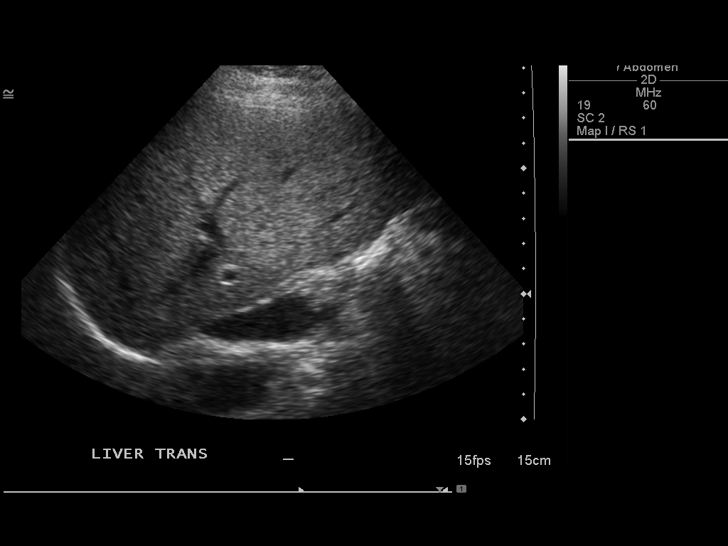
[im 79/79]
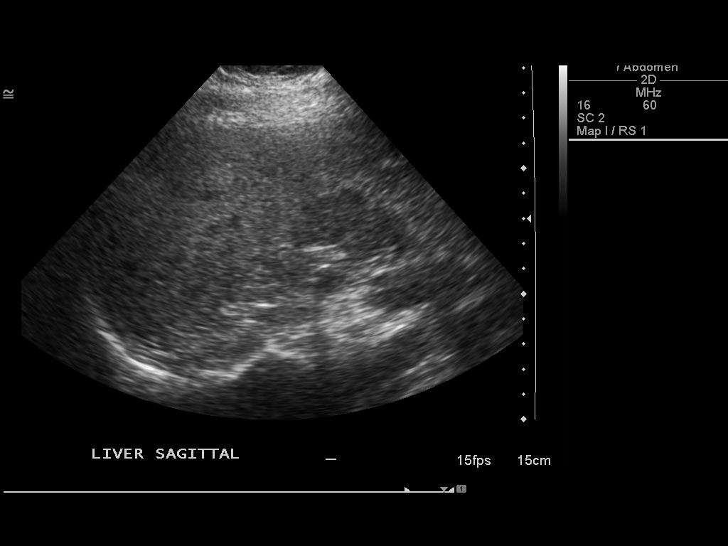

[14 of 25 positions shown; findings below may reference images not displayed]

FINDINGS: Gallbladder:  No gallstones, gallbladder wall thickening, or
pericholecystic fluid.

Common bile duct:  5.2 mm

Liver:  No focal lesion identified.  Within normal limits in
parenchymal echogenicity.

IVC:  Appears normal.

Pancreas:  No focal abnormality seen.

Spleen:  9.9 cm

Right Kidney:  10.7 cm.  Negative

Left Kidney:  10.5 cm.  Negative

Abdominal aorta:  No aneurysm identified.
IMPRESSION: Negative abdominal ultrasound.

## 2022-03-10 ENCOUNTER — Inpatient Hospital Stay (HOSPITAL_COMMUNITY)
Admission: EM | Admit: 2022-03-10 | Discharge: 2022-03-17 | DRG: 871 | Disposition: A | Discharge status: Home Health | Payer: No Typology Code available for payment source | Attending: Internal Medicine | Admitting: Internal Medicine

## 2022-03-10 ENCOUNTER — Emergency Department (HOSPITAL_COMMUNITY): Payer: No Typology Code available for payment source

## 2022-03-10 ENCOUNTER — Encounter (HOSPITAL_COMMUNITY): Payer: Self-pay

## 2022-03-10 DIAGNOSIS — I10 Essential (primary) hypertension: Secondary | ICD-10-CM | POA: Diagnosis present

## 2022-03-10 DIAGNOSIS — E876 Hypokalemia: Secondary | ICD-10-CM | POA: Diagnosis not present

## 2022-03-10 DIAGNOSIS — Z5320 Procedure and treatment not carried out because of patient's decision for unspecified reasons: Secondary | ICD-10-CM | POA: Diagnosis present

## 2022-03-10 DIAGNOSIS — E0810 Diabetes mellitus due to underlying condition with ketoacidosis without coma: Secondary | ICD-10-CM | POA: Diagnosis not present

## 2022-03-10 DIAGNOSIS — Z888 Allergy status to other drugs, medicaments and biological substances status: Secondary | ICD-10-CM

## 2022-03-10 DIAGNOSIS — A419 Sepsis, unspecified organism: Principal | ICD-10-CM | POA: Diagnosis present

## 2022-03-10 DIAGNOSIS — E860 Dehydration: Secondary | ICD-10-CM | POA: Diagnosis present

## 2022-03-10 DIAGNOSIS — K219 Gastro-esophageal reflux disease without esophagitis: Secondary | ICD-10-CM | POA: Diagnosis present

## 2022-03-10 DIAGNOSIS — E1110 Type 2 diabetes mellitus with ketoacidosis without coma: Secondary | ICD-10-CM | POA: Diagnosis present

## 2022-03-10 DIAGNOSIS — I2489 Other forms of acute ischemic heart disease: Secondary | ICD-10-CM | POA: Diagnosis present

## 2022-03-10 DIAGNOSIS — U071 COVID-19: Secondary | ICD-10-CM | POA: Diagnosis present

## 2022-03-10 DIAGNOSIS — N179 Acute kidney failure, unspecified: Secondary | ICD-10-CM | POA: Diagnosis not present

## 2022-03-10 DIAGNOSIS — Y929 Unspecified place or not applicable: Secondary | ICD-10-CM

## 2022-03-10 DIAGNOSIS — R6520 Severe sepsis without septic shock: Secondary | ICD-10-CM | POA: Diagnosis present

## 2022-03-10 DIAGNOSIS — Z6841 Body Mass Index (BMI) 40.0 and over, adult: Secondary | ICD-10-CM

## 2022-03-10 DIAGNOSIS — W1830XA Fall on same level, unspecified, initial encounter: Secondary | ICD-10-CM | POA: Diagnosis present

## 2022-03-10 DIAGNOSIS — Z880 Allergy status to penicillin: Secondary | ICD-10-CM

## 2022-03-10 DIAGNOSIS — I517 Cardiomegaly: Secondary | ICD-10-CM | POA: Diagnosis not present

## 2022-03-10 DIAGNOSIS — E0811 Diabetes mellitus due to underlying condition with ketoacidosis with coma: Secondary | ICD-10-CM | POA: Diagnosis not present

## 2022-03-10 DIAGNOSIS — H109 Unspecified conjunctivitis: Secondary | ICD-10-CM | POA: Diagnosis present

## 2022-03-10 DIAGNOSIS — Z91041 Radiographic dye allergy status: Secondary | ICD-10-CM

## 2022-03-10 DIAGNOSIS — H6693 Otitis media, unspecified, bilateral: Secondary | ICD-10-CM | POA: Diagnosis present

## 2022-03-10 DIAGNOSIS — Z8661 Personal history of infections of the central nervous system: Secondary | ICD-10-CM | POA: Diagnosis not present

## 2022-03-10 LAB — BASIC METABOLIC PANEL
BUN: 26 mg/dL — ABNORMAL HIGH (ref 6–20)
CO2: <7 mmol/L — ABNORMAL LOW (ref 22–32)
Calcium: 9 mg/dL (ref 8.9–10.3)
Chloride: 103 mmol/L (ref 98–111)
Creatinine, Ser: 1.34 mg/dL — ABNORMAL HIGH (ref 0.44–1.00)
GFR, Estimated: 46 mL/min — ABNORMAL LOW (ref >60)
Glucose, Bld: 528 mg/dL (ref 70–99)
Potassium: 3.5 mmol/L (ref 3.5–5.1)
Sodium: 131 mmol/L — ABNORMAL LOW (ref 135–145)

## 2022-03-10 LAB — PROTIME-INR
INR: 1.3 — ABNORMAL HIGH (ref 0.8–1.2)
Prothrombin Time: 16.5 seconds — ABNORMAL HIGH (ref 11.4–15.2)

## 2022-03-10 LAB — RESP PANEL BY RT-PCR (FLU A&B, COVID) ARPGX2
Influenza A by PCR: NEGATIVE
Influenza B by PCR: NEGATIVE
SARS Coronavirus 2 by RT PCR: POSITIVE — AB

## 2022-03-10 LAB — I-STAT BETA HCG BLOOD, ED (MC, WL, AP ONLY): I-stat hCG, quantitative: <5 m[IU]/mL (ref <5)

## 2022-03-10 LAB — PROCALCITONIN: Procalcitonin: 0.48 ng/mL

## 2022-03-10 LAB — CBC WITH DIFFERENTIAL/PLATELET
Abs Immature Granulocytes: 0.36 10*3/uL — ABNORMAL HIGH (ref 0.00–0.07)
Basophils Absolute: 0.1 10*3/uL (ref 0.0–0.1)
Basophils Relative: 1 %
Eosinophils Absolute: 0 10*3/uL (ref 0.0–0.5)
Eosinophils Relative: 0 %
HCT: 55.1 % — ABNORMAL HIGH (ref 36.0–46.0)
Hemoglobin: 18.2 g/dL — ABNORMAL HIGH (ref 12.0–15.0)
Immature Granulocytes: 2 %
Lymphocytes Relative: 6 %
Lymphs Abs: 1 10*3/uL (ref 0.7–4.0)
MCH: 30.3 pg (ref 26.0–34.0)
MCHC: 33 g/dL (ref 30.0–36.0)
MCV: 91.7 fL (ref 80.0–100.0)
Monocytes Absolute: 1.4 10*3/uL — ABNORMAL HIGH (ref 0.1–1.0)
Monocytes Relative: 8 %
Neutro Abs: 15.3 10*3/uL — ABNORMAL HIGH (ref 1.7–7.7)
Neutrophils Relative %: 83 %
Platelets: 309 10*3/uL (ref 150–400)
RBC: 6.01 MIL/uL — ABNORMAL HIGH (ref 3.87–5.11)
RDW: 12.7 % (ref 11.5–15.5)
WBC: 18.2 10*3/uL — ABNORMAL HIGH (ref 4.0–10.5)
nRBC: 0 % (ref 0.0–0.2)

## 2022-03-10 LAB — CBC
HCT: 50.8 % — ABNORMAL HIGH (ref 36.0–46.0)
Hemoglobin: 16.7 g/dL — ABNORMAL HIGH (ref 12.0–15.0)
MCH: 29.3 pg (ref 26.0–34.0)
MCHC: 32.9 g/dL (ref 30.0–36.0)
MCV: 89.1 fL (ref 80.0–100.0)
Platelets: 277 10*3/uL (ref 150–400)
RBC: 5.7 MIL/uL — ABNORMAL HIGH (ref 3.87–5.11)
RDW: 12.6 % (ref 11.5–15.5)
WBC: 18.9 10*3/uL — ABNORMAL HIGH (ref 4.0–10.5)
nRBC: 0 % (ref 0.0–0.2)

## 2022-03-10 LAB — LACTIC ACID, PLASMA
Lactic Acid, Venous: 3.7 mmol/L (ref 0.5–1.9)
Lactic Acid, Venous: 2.2 mmol/L (ref 0.5–1.9)

## 2022-03-10 LAB — GLUCOSE, CAPILLARY
Glucose-Capillary: 433 mg/dL — ABNORMAL HIGH (ref 70–99)
Glucose-Capillary: 378 mg/dL — ABNORMAL HIGH (ref 70–99)

## 2022-03-10 LAB — COMPREHENSIVE METABOLIC PANEL
ALT: 20 U/L (ref 0–44)
AST: 26 U/L (ref 15–41)
Albumin: 3.6 g/dL (ref 3.5–5.0)
Alkaline Phosphatase: 150 U/L — ABNORMAL HIGH (ref 38–126)
BUN: 27 mg/dL — ABNORMAL HIGH (ref 6–20)
CO2: <7 mmol/L — ABNORMAL LOW (ref 22–32)
Calcium: 9.4 mg/dL (ref 8.9–10.3)
Chloride: 96 mmol/L — ABNORMAL LOW (ref 98–111)
Creatinine, Ser: 1.71 mg/dL — ABNORMAL HIGH (ref 0.44–1.00)
GFR, Estimated: 35 mL/min — ABNORMAL LOW (ref >60)
Glucose, Bld: 697 mg/dL (ref 70–99)
Potassium: 5.2 mmol/L — ABNORMAL HIGH (ref 3.5–5.1)
Sodium: 127 mmol/L — ABNORMAL LOW (ref 135–145)
Total Bilirubin: 2 mg/dL — ABNORMAL HIGH (ref 0.3–1.2)
Total Protein: 8 g/dL (ref 6.5–8.1)

## 2022-03-10 LAB — MAGNESIUM: Magnesium: 1.9 mg/dL (ref 1.7–2.4)

## 2022-03-10 LAB — I-STAT VENOUS BLOOD GAS, ED
Acid-base deficit: 28 mmol/L — ABNORMAL HIGH (ref 0.0–2.0)
Bicarbonate: 3.3 mmol/L — ABNORMAL LOW (ref 20.0–28.0)
Calcium, Ion: 1.15 mmol/L (ref 1.15–1.40)
HCT: 58 % — ABNORMAL HIGH (ref 36.0–46.0)
Hemoglobin: 19.7 g/dL — ABNORMAL HIGH (ref 12.0–15.0)
O2 Saturation: 93 %
Potassium: 4.6 mmol/L (ref 3.5–5.1)
Sodium: 126 mmol/L — ABNORMAL LOW (ref 135–145)
TCO2: <5 mmol/L — ABNORMAL LOW (ref 22–32)
pCO2, Ven: 16.1 mmHg — CL (ref 44–60)
pH, Ven: 6.919 — CL (ref 7.25–7.43)
pO2, Ven: 109 mmHg — ABNORMAL HIGH (ref 32–45)

## 2022-03-10 LAB — URINALYSIS, ROUTINE W REFLEX MICROSCOPIC
Bacteria, UA: NONE SEEN
Bilirubin Urine: NEGATIVE
Glucose, UA: >=500 mg/dL — AB
Ketones, ur: 80 mg/dL — AB
Leukocytes,Ua: NEGATIVE
Nitrite: NEGATIVE
Protein, ur: 100 mg/dL — AB
Specific Gravity, Urine: 1.021 (ref 1.005–1.030)
pH: 5 (ref 5.0–8.0)

## 2022-03-10 LAB — CBG MONITORING, ED
Glucose-Capillary: >600 mg/dL (ref 70–99)
Glucose-Capillary: >600 mg/dL (ref 70–99)
Glucose-Capillary: 573 mg/dL (ref 70–99)
Glucose-Capillary: 523 mg/dL (ref 70–99)
Glucose-Capillary: 530 mg/dL (ref 70–99)

## 2022-03-10 LAB — HEMOGLOBIN A1C
Hgb A1c MFr Bld: 14 % — ABNORMAL HIGH (ref 4.8–5.6)
Mean Plasma Glucose: 355.1 mg/dL

## 2022-03-10 LAB — APTT: aPTT: 27 seconds (ref 24–36)

## 2022-03-10 LAB — BETA-HYDROXYBUTYRIC ACID
Beta-Hydroxybutyric Acid: >8 mmol/L — ABNORMAL HIGH (ref 0.05–0.27)
Beta-Hydroxybutyric Acid: 7.49 mmol/L — ABNORMAL HIGH (ref 0.05–0.27)

## 2022-03-10 LAB — HIV ANTIBODY (ROUTINE TESTING W REFLEX): HIV Screen 4th Generation wRfx: NONREACTIVE

## 2022-03-10 LAB — LIPASE, BLOOD: Lipase: 62 U/L — ABNORMAL HIGH (ref 11–51)

## 2022-03-10 LAB — PHOSPHORUS: Phosphorus: 3 mg/dL (ref 2.5–4.6)

## 2022-03-10 LAB — ETHANOL: Alcohol, Ethyl (B): <10 mg/dL (ref <10)

## 2022-03-10 LAB — AMMONIA: Ammonia: 26 umol/L (ref 9–35)

## 2022-03-10 MED ORDER — DEXTROSE IN LACTATED RINGERS 5 % IV SOLN: INTRAVENOUS | Status: DC

## 2022-03-10 MED ORDER — DEXTROSE 50 % IV SOLN: 0.0000 mL | INTRAVENOUS | Status: DC | PRN

## 2022-03-10 MED ORDER — SODIUM BICARBONATE 8.4 % IV SOLN
50.0000 meq | Freq: Once | INTRAVENOUS | Status: AC
  Administered 2022-03-10: 50 meq via INTRAVENOUS
  Filled 2022-03-10: qty 50

## 2022-03-10 MED ORDER — LACTATED RINGERS IV BOLUS (SEPSIS)
1000.0000 mL | Freq: Once | INTRAVENOUS | Status: AC
  Administered 2022-03-10: 1000 mL via INTRAVENOUS

## 2022-03-10 MED ORDER — LACTATED RINGERS IV SOLN: INTRAVENOUS | Status: DC

## 2022-03-10 MED ORDER — ORAL CARE MOUTH RINSE: 15.0000 mL | OROMUCOSAL | Status: DC | PRN

## 2022-03-10 MED ORDER — DOCUSATE SODIUM 100 MG PO CAPS: 100.0000 mg | ORAL_CAPSULE | Freq: Two times a day (BID) | ORAL | Status: DC | PRN

## 2022-03-10 MED ORDER — INSULIN REGULAR(HUMAN) IN NACL 100-0.9 UT/100ML-% IV SOLN
INTRAVENOUS | Status: DC
  Administered 2022-03-10: 13 [IU]/h via INTRAVENOUS
  Administered 2022-03-11: 3.2 [IU]/h via INTRAVENOUS
  Filled 2022-03-10 (×2): qty 100

## 2022-03-10 MED ORDER — INSULIN ASPART 100 UNIT/ML IJ SOLN
5.0000 [IU] | Freq: Once | INTRAMUSCULAR | Status: AC
  Administered 2022-03-10: 5 [IU] via INTRAVENOUS

## 2022-03-10 MED ORDER — CHLORHEXIDINE GLUCONATE CLOTH 2 % EX PADS
6.0000 | MEDICATED_PAD | Freq: Every day | CUTANEOUS | Status: DC
  Administered 2022-03-10 – 2022-03-16 (×7): 6 via TOPICAL

## 2022-03-10 MED ORDER — POLYETHYLENE GLYCOL 3350 17 G PO PACK: 17.0000 g | PACK | Freq: Every day | ORAL | Status: DC | PRN

## 2022-03-10 MED ORDER — SODIUM CHLORIDE 0.9 % IV SOLN
2.0000 g | INTRAVENOUS | Status: DC
  Administered 2022-03-10: 2 g via INTRAVENOUS
  Filled 2022-03-10: qty 20

## 2022-03-10 MED ORDER — LACTATED RINGERS IV SOLN: INTRAVENOUS | Status: AC

## 2022-03-10 MED ORDER — ONDANSETRON HCL 4 MG/2ML IJ SOLN
4.0000 mg | Freq: Once | INTRAMUSCULAR | Status: AC
  Administered 2022-03-10: 4 mg via INTRAVENOUS
  Filled 2022-03-10: qty 2

## 2022-03-10 MED ORDER — HEPARIN SODIUM (PORCINE) 5000 UNIT/ML IJ SOLN
5000.0000 [IU] | Freq: Three times a day (TID) | INTRAMUSCULAR | Status: DC
  Administered 2022-03-10 – 2022-03-16 (×19): 5000 [IU] via SUBCUTANEOUS
  Filled 2022-03-10 (×19): qty 1

## 2022-03-10 MED ORDER — SODIUM CHLORIDE 0.9 % IV SOLN
500.0000 mg | INTRAVENOUS | Status: DC
  Administered 2022-03-10: 500 mg via INTRAVENOUS
  Filled 2022-03-10: qty 5

## 2022-03-10 MED ORDER — NIRMATRELVIR/RITONAVIR (PAXLOVID) TABLET (RENAL DOSING)
2.0000 | ORAL_TABLET | Freq: Two times a day (BID) | ORAL | Status: DC
  Administered 2022-03-11 – 2022-03-13 (×6): 2 via ORAL
  Filled 2022-03-10: qty 20

## 2022-03-10 MED ORDER — IPRATROPIUM-ALBUTEROL 0.5-2.5 (3) MG/3ML IN SOLN: 3.0000 mL | Freq: Four times a day (QID) | RESPIRATORY_TRACT | Status: DC | PRN

## 2022-03-10 MED ORDER — LACTATED RINGERS IV BOLUS (SEPSIS)
500.0000 mL | Freq: Once | INTRAVENOUS | Status: AC
  Administered 2022-03-10: 500 mL via INTRAVENOUS

## 2022-03-10 MED ORDER — POTASSIUM CHLORIDE 10 MEQ/100ML IV SOLN
10.0000 meq | INTRAVENOUS | Status: AC
  Administered 2022-03-10 (×2): 10 meq via INTRAVENOUS
  Filled 2022-03-10 (×2): qty 100

## 2022-03-10 MED ORDER — MORPHINE SULFATE (PF) 4 MG/ML IV SOLN: 4.0000 mg | Freq: Once | INTRAVENOUS | Status: DC

## 2022-03-10 NOTE — H&P (Signed)
NAME:  Patricia Schmitt, MRN:  [id number], DOB:  September 14, 1965, LOS: 0 ADMISSION DATE:  03/10/2022, CONSULTATION DATE:  10/14 REFERRING MD:  Kris Hartmann, CHIEF COMPLAINT:  DKA   History of Present Illness:  Patricia Schmitt is a 56 y/o woman with a history of HTN, DM who presented with 5 days of nausea, vomiting, weakness, dyspnea, cough, congestion, fatigue, myalgias, decrease appetite. She recently traveled to [place] and [place] and her travel companion has RSV. She fell on the floor today and was unable to get up. EMS was called to bring her to the hospital. In the ED she was hypothermic, tachycardic, hypertensive and was found to have severe metabolic acidosis and hyperglycemia. Found to be in DKA. She was given 3.5L fluids per sepsis protocol as well as ceftriaxone and azithromycin. Started on insulin gtt. Blood cultures, UC, covid/flu, RVP obtained in the ED and results pending. PH on vbg 6.9. PCCM consulted for icu admission.  Pertinent ED labs: na 127, k 5.2, co2 <7, glucose 697, bun 27, creat 1.71, alk phosph 150, bili 2, LA 3.7, wbc 18.2,   Pertinent  Medical History  DM HTN obesity  Significant Hospital Events: Including procedures, antibiotic start and stop dates in addition to other pertinent events   10/14: admitted to [facility] for DKA  Interim History / Subjective:  See above  Objective   Blood pressure (!) 185/91, pulse (!) 107, temperature (!) 96.5 F (35.8 C), temperature source Rectal, resp. rate (!) 34, height '5\' 4"'  (1.626 m), weight 113.4 kg, SpO2 100 %.        Intake/Output Summary (Last 24 hours) at 03/10/2022 1851 Last data filed at 03/10/2022 1752 Gross per 24 hour  Intake 300 ml  Output --  Net 300 ml   Filed Weights   03/10/22 1707  Weight: 113.4 kg    Examination: General: Well-built, well-nourished woman appears fatigued and weak HEENT: Dry mucosa, no JVD, no pallor or icterus Neuro: Aox3; MAE; sedated; CV: s1s2, RRR , no m/r/g PULM: Coarse breath sounds  bilateral, acidotic breathing, no accessory muscle use GI: soft, bsx4 active  Extremities: warm/dry, no edema  Skin: no rashes or lesions    Venous pH 6.92  Resolved Hospital Problem list     Assessment & Plan:   Severe DKA  -New onset P: -admit to icu with continuous telemetry -insulin gtt per endo tool -volume resuscitation -serial bmp per protocol; replete electrolytes as needed -check A1c -Once CBGs down to 250 add dextrose to IV fluids  Sepsis  Acute viral infection, likely RSV based on recent contact P: -IV fluids -trend LA -bcx2 and uc pending -RVP, covid tests pending -agree with empiric ceftriaxone/azithromycin -check PCT   Pseudohyponatremia P: -correct hyperglycemia  Lactic acidosis Likely AKI vs CKD 3b P: -volume resuscitate -Trend BMP / urinary output -Replace electrolytes as indicated -Avoid nephrotoxic agents, ensure adequate renal perfusion  Hyperbilirubinemia likely 2/2 sepsis Elevated alk phosph P: -trend cmp -Abdominal exam is benign, hold off on ultrasound abdomen  GERD P: -PPI    Best Practice (right click and "Reselect all SmartList Selections" daily)   Diet/type: full liquids  DVT prophylaxis: prophylactic heparin  GI prophylaxis: PPI Lines: N/A Foley:  N/A Code Status:  full code Last date of multidisciplinary goals of care discussion [NA]  Labs   CBC: Recent Labs  Lab 03/10/22 1712 03/10/22 1823  WBC 18.2*  --   NEUTROABS 15.3*  --   HGB 18.2* 19.7*  HCT 55.1* 58.0*  MCV 91.7  --  PLT 309  --     Basic Metabolic Panel: Recent Labs  Lab 03/10/22 1712 03/10/22 1823  NA 127* 126*  K 5.2* 4.6  CL 96*  --   CO2 <7*  --   GLUCOSE 697*  --   BUN 27*  --   CREATININE 1.71*  --   CALCIUM 9.4  --    GFR: Estimated Creatinine Clearance: 44.8 mL/min (A) (by C-G formula based on SCr of 1.71 mg/dL (H)). Recent Labs  Lab 03/10/22 1711 03/10/22 1712  WBC  --  18.2*  LATICACIDVEN 3.7*  --     Liver  Function Tests: Recent Labs  Lab 03/10/22 1712  AST 26  ALT 20  ALKPHOS 150*  BILITOT 2.0*  PROT 8.0  ALBUMIN 3.6   No results for input(s): "LIPASE", "AMYLASE" in the last 168 hours. No results for input(s): "AMMONIA" in the last 168 hours.  ABG    Component Value Date/Time   HCO3 3.3 (L) 03/10/2022 1823   TCO2 <5 (L) 03/10/2022 1823   ACIDBASEDEF 28.0 (H) 03/10/2022 1823   O2SAT 93 03/10/2022 1823     Coagulation Profile: Recent Labs  Lab 03/10/22 1712  INR 1.3*    Cardiac Enzymes: No results for input(s): "CKTOTAL", "CKMB", "CKMBINDEX", "TROPONINI" in the last 168 hours.  HbA1C: Hgb A1c MFr Bld  Date/Time Value Ref Range Status  10/28/2012 02:14 PM 5.9 4.6 - 6.5 % Final    Comment:    Glycemic Control Guidelines for People with Diabetes:Non Diabetic:  <6%Goal of Therapy: <7%Additional Action Suggested:  >8%     CBG: Recent Labs  Lab 03/10/22 1714  GLUCAP >600*    Review of Systems:   Review of Systems  Constitutional:  Positive for malaise/fatigue.  HENT:  Positive for congestion.   Respiratory:  Positive for cough.   Musculoskeletal:  Positive for myalgias.     Past Medical History:  She,  has a past medical history of Diabetes mellitus without complication ([place]), OEUMPNTI(144.3), Hypertension, Meningitis spinal, and Obesity.   Surgical History:   Past Surgical History:  Procedure Laterality Date   ABDOMINAL HYSTERECTOMY  2007   TONSILLECTOMY  1985     Social History:   reports that she has never smoked. She has never used smokeless tobacco. She reports current alcohol use. She reports that she does not use drugs.   Family History:  Her family history is not on file.   Allergies Allergies  Allergen Reactions   Ace Inhibitors Other (See Comments)    Dizziness, balance issues   Amoxicillin Other (See Comments)    Stomach cramps   Erythromycin     Stomach cramps   Ivp Dye [Iodinated Contrast Media]     Nausea vomiting   Penicillins  Nausea And Vomiting     Home Medications  Prior to Admission medications   Medication Sig Start Date End Date Taking? Authorizing Provider  MOVIPREP 100 G SOLR Take 1 kit (100 g total) by mouth once. "Pharmacist please use BIN: [id number] GROUP: [id number] ID: [id number] Call [phone] for pharmacy questions "Pt will save $10" 10/01/12   Esterwood, Amy S, PA-C  omeprazole (PRILOSEC) 20 MG capsule Take 1 tab in the AM. 10/01/12   Esterwood, Amy S, PA-C     Critical care time: 45 minutes    Magon Croson V. Elsworth Soho MD [facility] 03/10/2022, 7:13 PM  Please see [email or web address] for pager details.  From 7A-7P if no response, please call [phone]. After hours,  please call ELink [phone].

## 2022-03-10 NOTE — ED Provider Notes (Signed)
[facility] DE[facility]MENT Provider Note   CSN: [id number] Arrival date & time: 03/10/22  1658     History  No chief complaint on file.   Patricia Schmitt is a 56 y.o. female.  The history is provided by the patient, the EMS personnel and medical records. No language interpreter was used.     56 year old female significant history of diabetes, hypertension, obesity, meningitis, brought here via EMS from home with concerns of respiratory infection.  History obtained through EMS, and patient.  Patient recently traveled with a friend from [place] admission and states a friend test positive for RSV.  It was approximately a week ago.  For the past 5 days she has had congestion, cough, generalized fatigue, body aches, decrease in appetite, eye discharge, and overall not feeling well.  States she was really weak today and unable to get up on her own.  She endorses nausea without vomiting.  She does not endorse any significant shortness of breath denies having pain in her chest or having any abdominal pain or urinary symptoms.  She did fell to the floor earlier today but denies any change in self.  She feels dehydrated and requesting for something to drink.  History is limited due to acuity of her condition.  Home Medications Prior to Admission medications   Medication Sig Start Date End Date Taking? Authorizing Provider  MOVIPREP 100 G SOLR Take 1 kit (100 g total) by mouth once. "Pharmacist please use BIN: [id number] GROUP: [id number] ID: [id number] Call [phone] for pharmacy questions "Pt will save $10" 10/01/12   Esterwood, Amy S, PA-C  omeprazole (PRILOSEC) 20 MG capsule Take 1 tab in the AM. 10/01/12   Esterwood, Amy S, PA-C      Allergies    Ace inhibitors, Amoxicillin, Erythromycin, Ivp dye [iodinated contrast media], and Penicillins    Review of Systems   Review of Systems  Unable to perform ROS: Acuity of condition    Physical Exam Updated Vital  Signs BP (!) 182/90   Pulse (!) 109   Temp (!) 96.5 F (35.8 C) (Rectal)   Resp (!) 30   Ht _0  (1.626 m)   Wt 113.4 kg   SpO2 98%   BMI 42.91 kg/m  Physical Exam Vitals and nursing note reviewed.  Constitutional:      General: She is in acute distress.     Appearance: She is well-developed. She is ill-appearing.     Comments: Ill-appearing obese female laying in bed appears tachypneic and uncomfortable.  HENT:     Head: Atraumatic.     Nose:     Comments: Dried blood noted in nares    Mouth/Throat:     Mouth: Mucous membranes are dry.     Comments: Mouth is dry, tongue is dry Eyes:     Comments: Bilateral conjunctival hemorrhage noted.  Cardiovascular:     Rate and Rhythm: Tachycardia present.  Pulmonary:     Comments: Decreased breath sounds, tachypnea, no obvious wheezes. Abdominal:     Palpations: Abdomen is soft.     Tenderness: There is no abdominal tenderness.  Musculoskeletal:     Cervical back: Normal range of motion and neck supple. No rigidity.     Comments: Able to move all 4 extremities with poor effort.  Skin:    Coloration: Skin is pale.     Findings: No rash.     Comments: Skin is cool to the touch.  No petechial rash.  Neurological:  Mental Status: She is alert. She is disoriented.  Psychiatric:        Mood and Affect: Mood normal.     ED Results / Procedures / Treatments   Labs (all labs ordered are listed, but only abnormal results are displayed) Labs Reviewed  RESP PANEL BY RT-PCR (FLU A&B, COVID) ARPGX2 - Abnormal; Notable for the following components:      Result Value   SARS Coronavirus 2 by RT PCR POSITIVE (*)    All other components within normal limits  LACTIC ACID, PLASMA - Abnormal; Notable for the following components:   Lactic Acid, Venous 3.7 (*)    All other components within normal limits  COMPREHENSIVE METABOLIC PANEL - Abnormal; Notable for the following components:   Sodium 127 (*)    Potassium 5.2 (*)    Chloride  96 (*)    CO2 <7 (*)    Glucose, Bld 697 (*)    BUN 27 (*)    Creatinine, Ser 1.71 (*)    Alkaline Phosphatase 150 (*)    Total Bilirubin 2.0 (*)    GFR, Estimated 35 (*)    All other components within normal limits  CBC WITH DIFFERENTIAL/PLATELET - Abnormal; Notable for the following components:   WBC 18.2 (*)    RBC 6.01 (*)    Hemoglobin 18.2 (*)    HCT 55.1 (*)    Neutro Abs 15.3 (*)    Monocytes Absolute 1.4 (*)    Abs Immature Granulocytes 0.36 (*)    All other components within normal limits  PROTIME-INR - Abnormal; Notable for the following components:   Prothrombin Time 16.5 (*)    INR 1.3 (*)    All other components within normal limits  URINALYSIS, ROUTINE W REFLEX MICROSCOPIC - Abnormal; Notable for the following components:   APPearance HAZY (*)    Glucose, UA >=500 (*)    Hgb urine dipstick SMALL (*)    Ketones, ur 80 (*)    Protein, ur 100 (*)    All other components within normal limits  BETA-HYDROXYBUTYRIC ACID - Abnormal; Notable for the following components:   Beta-Hydroxybutyric Acid >8.00 (*)    All other components within normal limits  I-STAT VENOUS BLOOD GAS, ED - Abnormal; Notable for the following components:   pH, Ven 6.919 (*)    pCO2, Ven 16.1 (*)    pO2, Ven 109 (*)    Bicarbonate 3.3 (*)    TCO2 <5 (*)    Acid-base deficit 28.0 (*)    Sodium 126 (*)    HCT 58.0 (*)    Hemoglobin 19.7 (*)    All other components within normal limits  CBG MONITORING, ED - Abnormal; Notable for the following components:   Glucose-Capillary >600 (*)    All other components within normal limits  CBG MONITORING, ED - Abnormal; Notable for the following components:   Glucose-Capillary >600 (*)    All other components within normal limits  CBG MONITORING, ED - Abnormal; Notable for the following components:   Glucose-Capillary 573 (*)    All other components within normal limits  CULTURE, BLOOD (ROUTINE X 2)  CULTURE, BLOOD (ROUTINE X 2)  URINE CULTURE   RESPIRATORY PANEL BY PCR  SARS CORONAVIRUS 2 BY RT PCR  APTT  OSMOLALITY  PROCALCITONIN  PROCALCITONIN  HEMOGLOBIN A1C  ETHANOL  HIV ANTIBODY (ROUTINE TESTING W REFLEX)  CBC  MAGNESIUM  PHOSPHORUS  LIPASE, BLOOD  LACTIC ACID, PLASMA  LACTIC ACID, PLASMA  CBC  COMPREHENSIVE METABOLIC PANEL  AMMONIA  BASIC METABOLIC PANEL  BASIC METABOLIC PANEL  BASIC METABOLIC PANEL  BETA-HYDROXYBUTYRIC ACID  BETA-HYDROXYBUTYRIC ACID  RAPID URINE DRUG SCREEN, HOSP PERFORMED  I-STAT BETA HCG BLOOD, ED ([facility], [facility], [facility] ONLY)  I-STAT VENOUS BLOOD GAS, ED    EKG EKG Interpretation  Date/Time:  Saturday March 10 2022 17:08:41 EDT Ventricular Rate:  108 PR Interval:  148 QRS Duration: 80 QT Interval:  354 QTC Calculation: 474 R Axis:   68 Text Interpretation: Sinus tachycardia Otherwise normal ECG When compared with ECG of 01-Sep-2012 06:49, PREVIOUS ECG IS PRESENT no stemi rate increased from prior Confirmed by Wynona Dove ([id number]) on 03/10/2022 5:10:08 PM  Radiology DG Chest Port 1 View  Result Date: 03/10/2022 CLINICAL DATA:  Questionable sepsis EXAM: PORTABLE CHEST 1 VIEW COMPARISON:  None Available. FINDINGS: Cardiomegaly. Both lungs are clear. The visualized skeletal structures are unremarkable. IMPRESSION: Cardiomegaly without acute abnormality of the lungs in AP projection. Electronically Signed   By: Delanna Ahmadi M.D.   On: 03/10/2022 17:30    Procedures .Critical Care  Performed by: Domenic Moras, PA-C Authorized by: Domenic Moras, PA-C   Critical care provider statement:    Critical care time (minutes):  85   Critical care was time spent personally by me on the following activities:  Development of treatment plan with patient or surrogate, discussions with consultants, evaluation of patient's response to treatment, examination of patient, ordering and review of laboratory studies, ordering and review of radiographic studies, ordering and performing treatments and interventions,  pulse oximetry, re-evaluation of patient's condition and review of old charts     Medications Ordered in ED Medications  lactated ringers infusion ( Intravenous New Bag/Given 03/10/22 1719)  lactated ringers bolus 1,000 mL (0 mLs Intravenous Stopped 03/10/22 1855)    And  lactated ringers bolus 1,000 mL (1,000 mLs Intravenous New Bag/Given 03/10/22 1724)    And  lactated ringers bolus 1,000 mL (1,000 mLs Intravenous New Bag/Given 03/10/22 1902)    And  lactated ringers bolus 500 mL (has no administration in time range)  cefTRIAXone (ROCEPHIN) 2 g in sodium chloride 0.9 % 100 mL IVPB (0 g Intravenous Stopped 03/10/22 1752)  azithromycin (ZITHROMAX) 500 mg in sodium chloride 0.9 % 250 mL IVPB (500 mg Intravenous New Bag/Given 03/10/22 1722)  morphine (PF) 4 MG/ML injection 4 mg (4 mg Intravenous Not Given 03/10/22 1902)  insulin regular, human (MYXREDLIN) 100 units/ 100 mL infusion (13 Units/hr Intravenous New Bag/Given 03/10/22 1913)  lactated ringers infusion (has no administration in time range)  dextrose 5 % in lactated ringers infusion (0 mLs Intravenous Hold 03/10/22 1854)  dextrose 50 % solution 0-50 mL (has no administration in time range)  ondansetron (ZOFRAN) injection 4 mg (4 mg Intravenous Given 03/10/22 1718)  insulin aspart (novoLOG) injection 5 Units (5 Units Intravenous Given 03/10/22 1729)  sodium bicarbonate injection 50 mEq (50 mEq Intravenous Given 03/10/22 1905)    ED Course/ Medical Decision Making/ A&P Clinical Course as of 03/10/22 1822  Sat Mar 10, 2022  1818 ALT: 20 [SG]    Clinical Course User Index [SG] Jeanell Sparrow, DO                           Medical Decision Making Amount and/or Complexity of Data Reviewed Labs: ordered. Decision-making details documented in ED Course. Radiology: ordered. ECG/medicine tests: ordered.  Risk Prescription drug management. Decision regarding hospitalization.   BP (!) 199/117 (  BP Location: Right Arm)   Pulse  (!) 108   Temp (!) 96.5 F (35.8 C) (Rectal)   Resp (!) 30   Ht _0  (1.626 m)   Wt 113.4 kg   SpO2 98%   BMI 42.91 kg/m   104:77 PM 56 year old female significant history of diabetes, hypertension, obesity, meningitis, brought here via EMS from home with concerns of respiratory infection.  History obtained through EMS, and patient.  Patient recently traveled with a friend from [place] admission and states a friend test positive for RSV.  It was approximately a week ago.  For the past 5 days she has had congestion, cough, generalized fatigue, body aches, decrease in appetite, eye discharge, and overall not feeling well.  States she was really weak today and unable to get up on her own.  She endorses nausea without vomiting.  She does not endorse any significant shortness of breath denies having pain in her chest or having any abdominal pain or urinary symptoms.  She did fell to the floor earlier today but denies any change in self.  She feels dehydrated and requesting for something to drink.  History is limited due to acuity of her condition.  On exam this is an obese female ill-appearing laying in bed moderately tachypneic.  Exam remarkable for bilateral conjunctival hemorrhage.  She also has dried blood in nares.  She has quite a bit of crusting around both eyelids.  Her mouth is extremely dry tongue is dry.  She does not have any nuchal rigidity.  She is tachycardic without any murmurs rubs or gallops.  Lungs with decreased breath sounds but no obvious wheezes or rales..  Abdomen is soft nontender.  She is able to move all 4 extremities with poor effort.  She is alert and oriented x3.  Vital sign notable for markedly elevated blood pressure of 818 systolic, her rectal temp is 96.5.  She is tachycardic with heart rate of 108.  She is tachypneic with respiratory rate of 30.  She unfortunately normal oxygen at 98% on room air.  Patient is tachypneic, with [place] breathing concerning for DKA or HHS.   EMS report that her initial CBG was greater than 300.  We will give IV fluid as well as insulin.  Work-up initiated. Patient is critically ill, care discussed with Dr. Pearline Cables.  6:09 PM Labs, EKG, and imaging obtained independently viewed interpreted by me and agree with radiologist and potation.  Initial CBG is greater than 600.  Actual CBG is 697.  Sodium is 127 likely reflecting pseudohyponatremia.  Potassium is 5.2.  Bicarb is less than 7.  Evidence of AKI with creatinine of 1.71.  Her anion gap is currently pending but I am concerned for either HHS or DKA.  She has an early white count of 18.2 likely consistent with an underlying infection.  Her lactic acid is markedly elevated at 3.7.  Please note code sepsis was initiated promptly upon her arrival and patient did receive fluid resuscitation at 30 mill per kilogram as well as antibiotic including Rocephin and Zithromax.  Her chest x-ray did not shows any pneumonia.  6:38 PM Appreciate consultation from pulmonary critical care team who agrees to see and will admit patient for further management of her condition.  Currently patient is receiving IV fluids, insulin, bicarb, antibiotic including Rocephin and Zithromax, opiate pain medication including morphine, and dextrose.  This patient presents to the ED for concern of Sepsis, this involves an extensive number of treatment options, and  is a complaint that carries with it a high risk of complications and morbidity.  The differential diagnosis includes viral sepsis, pna, uti, meningitis, dka  Co morbidities that complicate the patient evaluation diabetes  HTN Additional history obtained:  Additional history obtained from EMS External records from outside source obtained and reviewed including EMR including prior labs and imaging  Lab Tests:  I Ordered, and personally interpreted labs.  The pertinent results include:  as above  Imaging Studies ordered:  I ordered imaging studies including  CXR I independently visualized and interpreted imaging which showed no pna I agree with the radiologist interpretation  Cardiac Monitoring:  The patient was maintained on a cardiac monitor.  I personally viewed and interpreted the cardiac monitored which showed an underlying rhythm of: sinus tachycardia  Medicines ordered and prescription drug management:  I ordered medication including IVF  for dehydration Reevaluation of the patient after these medicines showed that the patient improved I have reviewed the patients home medicines and have made adjustments as needed  Test Considered: as above  Critical Interventions: sepsis protocol  DKA protocol  Consultations Obtained:  I requested consultation with the intensivist,  and discussed lab and imaging findings as well as pertinent plan - they recommend: admission  Problem List / ED Course: Sepsis (likely from RSV)  DKA  Reevaluation:  After the interventions noted above, I reevaluated the patient and found that they have :improved  Social Determinants of Health: none  Dispostion:  After consideration of the diagnostic results and the patients response to treatment, I feel that the patent would benefit from admission.         Final Clinical Impression(s) / ED Diagnoses Final diagnoses:  Sepsis with acute renal failure without septic shock, due to unspecified organism, unspecified acute renal failure type ([place])  Diabetic ketoacidosis without coma associated with type 2 diabetes mellitus [facility])    Rx / DC Orders ED Discharge Orders     None         Domenic Moras, PA-C 03/10/22 2052    Jeanell Sparrow, DO 03/11/22 2217

## 2022-03-10 NOTE — Progress Notes (Signed)
COVID-positive results noted. Symptoms less than 5 days. We will start Paxlovid with renal dosing. Hold off on steroids, given hyperglycemia  Contact and airborne precautions  Patricia Schmitt V. Elsworth Soho MD

## 2022-03-10 NOTE — Progress Notes (Signed)
eLink Physician-Brief Progress Note Patient Name: Patricia Schmitt DOB: 14-Oct-1965 MRN: [id number]   Date of Service  03/10/2022  HPI/Events of Note  56/F admitted for DKA, potentially precipitated by an active infection. She was started on fluids, insulin, as well as empiric antibiotics. She was subsequently admitted to the ICU  Labs and imaging briefly reviewed. Glucose 528, bicarb <7, anion gap >21. WBC 18.1, lactate 2.2  eICU Interventions  DKA, severe - Started on IV insulin. Will plan to transition to [facility] insulin once anion gap has closed and glucoses remain controlled - Continue IVF. Will switch to dextrose containing fluids once glucose 250mg /dL - Serial BMP, replete K as needed.   2. Sepsis - Possibly due to RSV - Continue IVF. PRN acetaminophen - Started on empiric antibiotics.  - Follow up cultures, viral tests. Will deescalate antibiotics as warranted - Trend WBC, lactate, temperature curve.         [place] 03/10/2022, 10:31 PM

## 2022-03-10 NOTE — ED Notes (Signed)
Report called to 65[facility] ICU.

## 2022-03-10 NOTE — Sepsis Progress Note (Signed)
Sepsis protocol is being followed by eLink. 

## 2022-03-10 NOTE — ED Triage Notes (Signed)
Pt BIBA from home. Pt just returned from recent travel to [place] and [place]. Pt's travel companion + RSV.  Pt has been sick x 5 days. Pt c/o SHOB, N/V, weakness. Pt fell to floor, unable to get up today. No obvious injury.

## 2022-03-10 NOTE — ED Notes (Signed)
Report given and care endorsed to Crystal RN.  

## 2022-03-11 DIAGNOSIS — E1110 Type 2 diabetes mellitus with ketoacidosis without coma: Secondary | ICD-10-CM

## 2022-03-11 DIAGNOSIS — U071 COVID-19: Secondary | ICD-10-CM

## 2022-03-11 LAB — CBC
HCT: 42.8 % (ref 36.0–46.0)
Hemoglobin: 15.4 g/dL — ABNORMAL HIGH (ref 12.0–15.0)
MCH: 29.8 pg (ref 26.0–34.0)
MCHC: 36 g/dL (ref 30.0–36.0)
MCV: 82.9 fL (ref 80.0–100.0)
Platelets: 201 10*3/uL (ref 150–400)
RBC: 5.16 MIL/uL — ABNORMAL HIGH (ref 3.87–5.11)
RDW: 12.4 % (ref 11.5–15.5)
WBC: 18 10*3/uL — ABNORMAL HIGH (ref 4.0–10.5)
nRBC: 0 % (ref 0.0–0.2)

## 2022-03-11 LAB — RESPIRATORY PANEL BY PCR

## 2022-03-11 LAB — GLUCOSE, CAPILLARY
Glucose-Capillary: 155 mg/dL — ABNORMAL HIGH (ref 70–99)
Glucose-Capillary: 160 mg/dL — ABNORMAL HIGH (ref 70–99)
Glucose-Capillary: 172 mg/dL — ABNORMAL HIGH (ref 70–99)
Glucose-Capillary: 172 mg/dL — ABNORMAL HIGH (ref 70–99)
Glucose-Capillary: 173 mg/dL — ABNORMAL HIGH (ref 70–99)
Glucose-Capillary: 174 mg/dL — ABNORMAL HIGH (ref 70–99)
Glucose-Capillary: 178 mg/dL — ABNORMAL HIGH (ref 70–99)
Glucose-Capillary: 186 mg/dL — ABNORMAL HIGH (ref 70–99)
Glucose-Capillary: 186 mg/dL — ABNORMAL HIGH (ref 70–99)
Glucose-Capillary: 190 mg/dL — ABNORMAL HIGH (ref 70–99)
Glucose-Capillary: 194 mg/dL — ABNORMAL HIGH (ref 70–99)
Glucose-Capillary: 197 mg/dL — ABNORMAL HIGH (ref 70–99)
Glucose-Capillary: 200 mg/dL — ABNORMAL HIGH (ref 70–99)
Glucose-Capillary: 201 mg/dL — ABNORMAL HIGH (ref 70–99)
Glucose-Capillary: 210 mg/dL — ABNORMAL HIGH (ref 70–99)
Glucose-Capillary: 210 mg/dL — ABNORMAL HIGH (ref 70–99)
Glucose-Capillary: 212 mg/dL — ABNORMAL HIGH (ref 70–99)
Glucose-Capillary: 212 mg/dL — ABNORMAL HIGH (ref 70–99)
Glucose-Capillary: 217 mg/dL — ABNORMAL HIGH (ref 70–99)
Glucose-Capillary: 223 mg/dL — ABNORMAL HIGH (ref 70–99)
Glucose-Capillary: 303 mg/dL — ABNORMAL HIGH (ref 70–99)

## 2022-03-11 LAB — COMPREHENSIVE METABOLIC PANEL
ALT: 17 U/L (ref 0–44)
AST: 15 U/L (ref 15–41)
Albumin: 2.6 g/dL — ABNORMAL LOW (ref 3.5–5.0)
Alkaline Phosphatase: 90 U/L (ref 38–126)
Anion gap: 11 (ref 5–15)
BUN: 20 mg/dL (ref 6–20)
CO2: 14 mmol/L — ABNORMAL LOW (ref 22–32)
Calcium: 9.1 mg/dL (ref 8.9–10.3)
Chloride: 108 mmol/L (ref 98–111)
Creatinine, Ser: 0.91 mg/dL (ref 0.44–1.00)
GFR, Estimated: >60 mL/min (ref >60)
Glucose, Bld: 204 mg/dL — ABNORMAL HIGH (ref 70–99)
Potassium: 3 mmol/L — ABNORMAL LOW (ref 3.5–5.1)
Sodium: 133 mmol/L — ABNORMAL LOW (ref 135–145)
Total Bilirubin: 0.5 mg/dL (ref 0.3–1.2)
Total Protein: 6.2 g/dL — ABNORMAL LOW (ref 6.5–8.1)

## 2022-03-11 LAB — BASIC METABOLIC PANEL
Anion gap: 19 — ABNORMAL HIGH (ref 5–15)
Anion gap: 9 (ref 5–15)
BUN: 17 mg/dL (ref 6–20)
BUN: 23 mg/dL — ABNORMAL HIGH (ref 6–20)
CO2: 19 mmol/L — ABNORMAL LOW (ref 22–32)
CO2: 9 mmol/L — ABNORMAL LOW (ref 22–32)
Calcium: 9 mg/dL (ref 8.9–10.3)
Calcium: 9.3 mg/dL (ref 8.9–10.3)
Chloride: 106 mmol/L (ref 98–111)
Chloride: 106 mmol/L (ref 98–111)
Creatinine, Ser: 0.71 mg/dL (ref 0.44–1.00)
Creatinine, Ser: 1.23 mg/dL — ABNORMAL HIGH (ref 0.44–1.00)
GFR, Estimated: 51 mL/min — ABNORMAL LOW (ref >60)
GFR, Estimated: >60 mL/min (ref >60)
Glucose, Bld: 193 mg/dL — ABNORMAL HIGH (ref 70–99)
Glucose, Bld: 318 mg/dL — ABNORMAL HIGH (ref 70–99)
Potassium: 3 mmol/L — ABNORMAL LOW (ref 3.5–5.1)
Potassium: 3.6 mmol/L (ref 3.5–5.1)
Sodium: 134 mmol/L — ABNORMAL LOW (ref 135–145)
Sodium: 134 mmol/L — ABNORMAL LOW (ref 135–145)

## 2022-03-11 LAB — MAGNESIUM: Magnesium: 1.6 mg/dL — ABNORMAL LOW (ref 1.7–2.4)

## 2022-03-11 LAB — BETA-HYDROXYBUTYRIC ACID
Beta-Hydroxybutyric Acid: 2.03 mmol/L — ABNORMAL HIGH (ref 0.05–0.27)
Beta-Hydroxybutyric Acid: 1.86 mmol/L — ABNORMAL HIGH (ref 0.05–0.27)

## 2022-03-11 LAB — TROPONIN I (HIGH SENSITIVITY)
Troponin I (High Sensitivity): 23 ng/L — ABNORMAL HIGH (ref <18)
Troponin I (High Sensitivity): 18 ng/L — ABNORMAL HIGH (ref <18)

## 2022-03-11 LAB — PROCALCITONIN: Procalcitonin: 0.94 ng/mL

## 2022-03-11 LAB — MRSA NEXT GEN BY PCR, NASAL: MRSA by PCR Next Gen: NOT DETECTED

## 2022-03-11 LAB — OSMOLALITY: Osmolality: 323 mOsm/kg (ref 275–295)

## 2022-03-11 LAB — LACTIC ACID, PLASMA: Lactic Acid, Venous: 2.3 mmol/L (ref 0.5–1.9)

## 2022-03-11 MED ORDER — ONDANSETRON HCL 4 MG/2ML IJ SOLN
4.0000 mg | Freq: Four times a day (QID) | INTRAMUSCULAR | Status: DC | PRN
  Administered 2022-03-11 – 2022-03-14 (×2): 4 mg via INTRAVENOUS
  Filled 2022-03-11 (×2): qty 2

## 2022-03-11 MED ORDER — MAGNESIUM SULFATE 4 GM/100ML IV SOLN
4.0000 g | Freq: Once | INTRAVENOUS | Status: AC
  Administered 2022-03-11: 4 g via INTRAVENOUS
  Filled 2022-03-11: qty 100

## 2022-03-11 MED ORDER — OFLOXACIN 0.3 % OP SOLN
1.0000 [drp] | Freq: Four times a day (QID) | OPHTHALMIC | Status: DC
  Administered 2022-03-11 – 2022-03-16 (×22): 1 [drp] via OPHTHALMIC
  Filled 2022-03-11: qty 5

## 2022-03-11 MED ORDER — LACTATED RINGERS IV SOLN: INTRAVENOUS | Status: DC

## 2022-03-11 MED ORDER — POTASSIUM CHLORIDE CRYS ER 20 MEQ PO TBCR
40.0000 meq | EXTENDED_RELEASE_TABLET | ORAL | Status: AC
  Administered 2022-03-11 (×2): 40 meq via ORAL
  Filled 2022-03-11 (×2): qty 2

## 2022-03-11 MED ORDER — ACETAMINOPHEN 325 MG PO TABS
650.0000 mg | ORAL_TABLET | Freq: Four times a day (QID) | ORAL | Status: DC | PRN
  Filled 2022-03-11 (×2): qty 2

## 2022-03-11 MED ORDER — INSULIN DETEMIR 100 UNIT/ML ~~LOC~~ SOLN
10.0000 [IU] | Freq: Once | SUBCUTANEOUS | Status: AC
  Administered 2022-03-11: 10 [IU] via SUBCUTANEOUS
  Filled 2022-03-11: qty 0.1

## 2022-03-11 MED ORDER — INSULIN STARTER KIT- PEN NEEDLES (ENGLISH)
1.0000 | Freq: Once | Status: AC
  Administered 2022-03-14: 1
  Filled 2022-03-11 (×3): qty 1

## 2022-03-11 MED ORDER — INSULIN ASPART 100 UNIT/ML IJ SOLN
0.0000 [IU] | Freq: Three times a day (TID) | INTRAMUSCULAR | Status: DC
  Administered 2022-03-11: 3 [IU] via SUBCUTANEOUS
  Administered 2022-03-12: 11 [IU] via SUBCUTANEOUS
  Administered 2022-03-12: 5 [IU] via SUBCUTANEOUS
  Administered 2022-03-12: 8 [IU] via SUBCUTANEOUS
  Administered 2022-03-13: 5 [IU] via SUBCUTANEOUS
  Administered 2022-03-13: 8 [IU] via SUBCUTANEOUS
  Administered 2022-03-13 – 2022-03-14 (×4): 5 [IU] via SUBCUTANEOUS

## 2022-03-11 MED ORDER — OFLOXACIN 0.3 % OP SOLN
1.0000 [drp] | Freq: Four times a day (QID) | OPHTHALMIC | Status: DC
  Administered 2022-03-11 (×2): 1 [drp] via OPHTHALMIC
  Filled 2022-03-11: qty 5

## 2022-03-11 MED ORDER — INSULIN ASPART 100 UNIT/ML IJ SOLN
0.0000 [IU] | Freq: Every day | INTRAMUSCULAR | Status: DC
  Administered 2022-03-11: 2 [IU] via SUBCUTANEOUS

## 2022-03-11 MED ORDER — LIVING WELL WITH DIABETES BOOK
Freq: Once | Status: DC
  Filled 2022-03-11 (×3): qty 1

## 2022-03-11 MED ORDER — OXYCODONE HCL 5 MG PO TABS: 5.0000 mg | ORAL_TABLET | ORAL | Status: DC | PRN

## 2022-03-11 MED ORDER — INSULIN DETEMIR 100 UNIT/ML ~~LOC~~ SOLN
20.0000 [IU] | Freq: Every day | SUBCUTANEOUS | Status: DC
  Administered 2022-03-12: 20 [IU] via SUBCUTANEOUS
  Filled 2022-03-11: qty 0.2

## 2022-03-11 MED ORDER — AMLODIPINE BESYLATE 5 MG PO TABS
5.0000 mg | ORAL_TABLET | Freq: Every day | ORAL | Status: DC
  Administered 2022-03-11 – 2022-03-14 (×4): 5 mg via ORAL
  Filled 2022-03-11 (×4): qty 1

## 2022-03-11 MED ORDER — OFLOXACIN 0.3 % OP SOLN
5.0000 [drp] | Freq: Every day | OPHTHALMIC | Status: DC
  Administered 2022-03-11 – 2022-03-16 (×6): 5 [drp] via OTIC
  Filled 2022-03-11: qty 5

## 2022-03-11 NOTE — Progress Notes (Signed)
eLink Physician-Brief Progress Note Patient Name: Patricia Schmitt DOB: [birth year] MRN: [id number]   Date of Service  03/11/2022  HPI/Events of Note  Pt complaining of abdominal pain but not able to characterize further.  Pt also refusing tylenol.   Pt is being treated for DKA.  eICU Interventions  Trial of oxycodone PRN.      Intervention Category Intermediate Interventions: Other:;Abdominal pain - evaluation and management  Elsie Lincoln 03/11/2022, 11:36 PM

## 2022-03-11 NOTE — H&P (Addendum)
Pharmacy Electrolyte Replacement  Recent Labs:  Recent Labs    03/10/22 2040 03/11/22 0003 03/11/22 1204  K 3.5   < > 3.0*  MG 1.9  --  1.6*  PHOS 3.0  --   --   CREATININE 1.34*   < > 0.71   < > = values in this interval not displayed.    Low Critical Values (K </= 2.5, Phos </= 1, Mg </= 1) Present: None  MD Contacted: n/a  Plan:  KCl 70mEq po every 4 hours x2 doses. BMP in AM.  Magnesium 4g IV x1. Magnesium in AM.    Sloan Leiter, PharmD, BCPS, BCCCP Clinical Pharmacist Please refer to [facility] for [place] numbers 03/11/2022, 9:13 AM

## 2022-03-11 NOTE — Progress Notes (Signed)
On assessment to unit, patient's eyes and L ear had purulent white drainage. Patient stated this was a new ear infection and it was hurting. Notified E-link John RN of findings; John to [employer] to [employer] MD.

## 2022-03-11 NOTE — Inpatient Diabetes Management (Signed)
Inpatient Diabetes Program Recommendations  AACE/ADA: New Consensus Statement on Inpatient Glycemic Control (2015)  Target Ranges:  Prepandial:   less than 140 mg/dL      Peak postprandial:   less than 180 mg/dL (1-2 hours)      Critically ill patients:  140 - 180 mg/dL   Lab Results  Component Value Date   GLUCAP 201 (H) 03/11/2022   HGBA1C 14.0 (H) 03/10/2022    Review of Glycemic Control  Diabetes history: DM2 Outpatient Diabetes medications: None per EMR Current orders for Inpatient glycemic control: IV insulin per [employer] for DKA  HgbA1C - 14% CO2 - 14    AG - 11 Beta-hydroxy 2.03  Not ready to transition to SQ insulin at this point.  Inpatient Diabetes Program Recommendations:    Continue with IV insulin until criteria met for discontinuation. Give Levemir 1-2 H prior to d/cing IV insulin.   Consider Levemir 20 units QD, Novolog 0-15 units Q4H. When diet begins, change Novolog to 0-15 TID with meals and 0-5 HS  Will order Living Well book and insulin pen starter kit.  Will need TOC consult for PCP/ assistance with meds. Pt does not have insurance and will need affordable meds.  Diabetes Coordinator to see in am.   Thank you. Lorenda Peck, RD, LDN, [place] Inpatient Diabetes Coordinator [phone]

## 2022-03-11 NOTE — Progress Notes (Signed)
NAME:  Patricia Schmitt, MRN:  [id number], DOB:  09-23-1965, LOS: 1 ADMISSION DATE:  03/10/2022, CONSULTATION DATE:  10/14 REFERRING MD:  Kris Hartmann, CHIEF COMPLAINT:  DKA   History of Present Illness:  Patricia Schmitt is a 56 y/o woman with a history of HTN, DM who presented with 5 days of nausea, vomiting, weakness, dyspnea, cough, congestion, fatigue, myalgias, decrease appetite. She recently traveled to [place] and [place] and her travel companion has RSV. She fell on the floor today and was unable to get up. EMS was called to bring her to the hospital. In the ED she was hypothermic, tachycardic, hypertensive and was found to have severe metabolic acidosis and hyperglycemia. Found to be in DKA. She was given 3.5L fluids per sepsis protocol as well as ceftriaxone and azithromycin. Started on insulin gtt. Blood cultures, UC, covid/flu, RVP obtained in the ED and results pending. PH on vbg 6.9. PCCM consulted for icu admission.  Pertinent ED labs: na 127, k 5.2, co2 <7, glucose 697, bun 27, creat 1.71, alk phosph 150, bili 2, LA 3.7, wbc 18.2,   Pertinent  Medical History  DM HTN obesity  Significant Hospital Events: Including procedures, antibiotic start and stop dates in addition to other pertinent events   10/14: admitted to [facility] for DKA  Interim History / Subjective:  Changed to D5LR overnight for CBG < 250 Midnight labs w persistent severe AGMA   AM Bmp, beta hydroxy pending   Objective   Blood pressure (!) 180/89, pulse 87, temperature 97.8 F (36.6 C), temperature source Axillary, resp. rate (!) 21, height '5\' 4"'  (1.626 m), weight 114 kg, SpO2 98 %.        Intake/Output Summary (Last 24 hours) at 03/11/2022 0801 Last data filed at 03/11/2022 0600 Gross per 24 hour  Intake 5260.06 ml  Output 850 ml  Net 4410.06 ml    Filed Weights   03/10/22 1707 03/11/22 0500  Weight: 113.4 kg 114 kg    Examination: General: [name] reclined in bed  HEENT: NCAT dry oral mucosa. L ear  with tan crusty drainage. L eye with some thin drainage. Bilateral subconjunctival hemorrhage   Neuro: AAOx3, slightly lethargic  CV: rrr s1s2  PULM: Symmetrical chest expansion. Coarse sounds  GI: thin soft ndnt  Extremities: no acute joint deformity  Skin: c/d/w   Resolved Hospital Problem list    AKI  Hyperbilirubinemia, improved  Elevated alk phosph, improved   Assessment & Plan:   DKA with severe acidosis  -initial pH < 7 P: -Endotool protocol -awaiting latest metabolic panel and beta hydroxybutyric acid -- hope to transition off of insulin gtt today -replace lytes as needed pending labs  -DM coordinator  COVID infection without pneumonia  Possible otitis and conjunctivitis  -PCT was only 0.48, dont think she has profound bacterial infection. That said WBC remained 18, but think between DKA, covid, dehydration this is pretty well explained  P: -dc CAP abx , if pending PCT suggests otherwise, can add back  -Follow Cx data -Add ofloxacin ear and eye drops  -Paxlovid  -IS, mobility   Pseudohyponatremia  P: -correct hyperglycemia  Lactic acidosis in setting of above P: -supportive care as above   GERD  P: -PPI    Best Practice (right click and "Reselect all SmartList Selections" daily)   Diet/type: full liquids  DVT prophylaxis: prophylactic heparin  GI prophylaxis: PPI Lines: N/A Foley:  N/A Code Status:  full code Last date of multidisciplinary goals of care discussion [  NA]  Labs   CBC: Recent Labs  Lab 03/10/22 1712 03/10/22 1823 03/10/22 2040 03/11/22 0722  WBC 18.2*  --  18.9* 18.0*  NEUTROABS 15.3*  --   --   --   HGB 18.2* 19.7* 16.7* 15.4*  HCT 55.1* 58.0* 50.8* 42.8  MCV 91.7  --  89.1 82.9  PLT 309  --  277 201     Basic Metabolic Panel: Recent Labs  Lab 03/10/22 1712 03/10/22 1823 03/10/22 2040 03/11/22 0003  NA 127* 126* 131* 134*  K 5.2* 4.6 3.5 3.6  CL 96*  --  103 106  CO2 <7*  --  <7* 9*  GLUCOSE 697*  --  528*  318*  BUN 27*  --  26* 23*  CREATININE 1.71*  --  1.34* 1.23*  CALCIUM 9.4  --  9.0 9.3  MG  --   --  1.9  --   PHOS  --   --  3.0  --     GFR: Estimated Creatinine Clearance: 62.5 mL/min (A) (by C-G formula based on SCr of 1.23 mg/dL (H)). Recent Labs  Lab 03/10/22 1711 03/10/22 1712 03/10/22 2040 03/11/22 0003 03/11/22 0722  PROCALCITON  --   --  0.48  --   --   WBC  --  18.2* 18.9*  --  18.0*  LATICACIDVEN 3.7*  --  2.2* 2.3*  --      Liver Function Tests: Recent Labs  Lab 03/10/22 1712  AST 26  ALT 20  ALKPHOS 150*  BILITOT 2.0*  PROT 8.0  ALBUMIN 3.6    Recent Labs  Lab 03/10/22 2040  LIPASE 62*   Recent Labs  Lab 03/10/22 2040  AMMONIA 26    ABG    Component Value Date/Time   HCO3 3.3 (L) 03/10/2022 1823   TCO2 <5 (L) 03/10/2022 1823   ACIDBASEDEF 28.0 (H) 03/10/2022 1823   O2SAT 93 03/10/2022 1823     Coagulation Profile: Recent Labs  Lab 03/10/22 1712  INR 1.3*     Cardiac Enzymes: No results for input(s): "CKTOTAL", "CKMB", "CKMBINDEX", "TROPONINI" in the last 168 hours.  HbA1C: Hgb A1c MFr Bld  Date/Time Value Ref Range Status  03/10/2022 08:40 PM 14.0 (H) 4.8 - 5.6 % Final    Comment:    (NOTE) Pre diabetes:          5.7%-6.4%  Diabetes:              >6.4%  Glycemic control for   <7.0% adults with diabetes   10/28/2012 02:14 PM 5.9 4.6 - 6.5 % Final    Comment:    Glycemic Control Guidelines for People with Diabetes:Non Diabetic:  <6%Goal of Therapy: <7%Additional Action Suggested:  >8%     CBG: Recent Labs  Lab 03/11/22 0205 03/11/22 0311 03/11/22 0413 03/11/22 0505 03/11/22 0616  GLUCAP 186* 212* 210* 194* 217*      CRITICAL CARE Performed by: Cristal Generous   Total critical care time: 36 minutes  Critical care time was exclusive of separately billable procedures and treating other patients. Critical care was necessary to treat or prevent imminent or life-threatening deterioration.  Critical care  was time spent personally by me on the following activities: development of treatment plan with patient and/or surrogate as well as nursing, discussions with consultants, evaluation of patient's response to treatment, examination of patient, obtaining history from patient or surrogate, ordering and performing treatments and interventions, ordering and review of laboratory studies, ordering and  review of radiographic studies, pulse oximetry and re-evaluation of patient's condition.  Eliseo Gum MSN, AGACNP-BC [place] for pager  03/11/2022, 10:01 AM

## 2022-03-12 ENCOUNTER — Encounter (HOSPITAL_COMMUNITY): Payer: Self-pay | Admitting: Pulmonary Disease

## 2022-03-12 ENCOUNTER — Other Ambulatory Visit: Payer: Self-pay

## 2022-03-12 ENCOUNTER — Inpatient Hospital Stay (HOSPITAL_COMMUNITY): Payer: No Typology Code available for payment source

## 2022-03-12 DIAGNOSIS — I517 Cardiomegaly: Secondary | ICD-10-CM

## 2022-03-12 DIAGNOSIS — E0810 Diabetes mellitus due to underlying condition with ketoacidosis without coma: Secondary | ICD-10-CM

## 2022-03-12 LAB — CBC WITH DIFFERENTIAL/PLATELET
Abs Immature Granulocytes: 0.08 10*3/uL — ABNORMAL HIGH (ref 0.00–0.07)
Basophils Absolute: 0 10*3/uL (ref 0.0–0.1)
Basophils Relative: 0 %
Eosinophils Absolute: 0 10*3/uL (ref 0.0–0.5)
Eosinophils Relative: 0 %
HCT: 39 % (ref 36.0–46.0)
Hemoglobin: 14.5 g/dL (ref 12.0–15.0)
Immature Granulocytes: 1 %
Lymphocytes Relative: 11 %
Lymphs Abs: 1.4 10*3/uL (ref 0.7–4.0)
MCH: 30.2 pg (ref 26.0–34.0)
MCHC: 37.2 g/dL — ABNORMAL HIGH (ref 30.0–36.0)
MCV: 81.3 fL (ref 80.0–100.0)
Monocytes Absolute: 1.3 10*3/uL — ABNORMAL HIGH (ref 0.1–1.0)
Monocytes Relative: 10 %
Neutro Abs: 10.8 10*3/uL — ABNORMAL HIGH (ref 1.7–7.7)
Neutrophils Relative %: 78 %
Platelets: 203 10*3/uL (ref 150–400)
RBC: 4.8 MIL/uL (ref 3.87–5.11)
RDW: 12.7 % (ref 11.5–15.5)
WBC: 13.7 10*3/uL — ABNORMAL HIGH (ref 4.0–10.5)
nRBC: 0 % (ref 0.0–0.2)

## 2022-03-12 LAB — BASIC METABOLIC PANEL
Anion gap: 12 (ref 5–15)
BUN: 13 mg/dL (ref 6–20)
CO2: 18 mmol/L — ABNORMAL LOW (ref 22–32)
Calcium: 8.6 mg/dL — ABNORMAL LOW (ref 8.9–10.3)
Chloride: 106 mmol/L (ref 98–111)
Creatinine, Ser: 0.76 mg/dL (ref 0.44–1.00)
GFR, Estimated: >60 mL/min (ref >60)
Glucose, Bld: 265 mg/dL — ABNORMAL HIGH (ref 70–99)
Potassium: 3.4 mmol/L — ABNORMAL LOW (ref 3.5–5.1)
Sodium: 136 mmol/L (ref 135–145)

## 2022-03-12 LAB — ECHOCARDIOGRAM COMPLETE
Area-P 1/2: 4.96 cm2
Height: 64 in
S' Lateral: 2.7 cm
Weight: 4014.14 oz

## 2022-03-12 LAB — URINE CULTURE

## 2022-03-12 LAB — GLUCOSE, CAPILLARY
Glucose-Capillary: 283 mg/dL — ABNORMAL HIGH (ref 70–99)
Glucose-Capillary: 305 mg/dL — ABNORMAL HIGH (ref 70–99)
Glucose-Capillary: 236 mg/dL — ABNORMAL HIGH (ref 70–99)
Glucose-Capillary: 164 mg/dL — ABNORMAL HIGH (ref 70–99)

## 2022-03-12 LAB — LIPASE, BLOOD: Lipase: 38 U/L (ref 11–51)

## 2022-03-12 LAB — MAGNESIUM: Magnesium: 2.3 mg/dL (ref 1.7–2.4)

## 2022-03-12 LAB — PROCALCITONIN: Procalcitonin: 0.62 ng/mL

## 2022-03-12 MED ORDER — LISINOPRIL 10 MG PO TABS
5.0000 mg | ORAL_TABLET | Freq: Every day | ORAL | Status: DC
  Administered 2022-03-12 – 2022-03-16 (×5): 5 mg via ORAL
  Filled 2022-03-12 (×5): qty 1

## 2022-03-12 MED ORDER — CEFDINIR 300 MG PO CAPS
300.0000 mg | ORAL_CAPSULE | Freq: Two times a day (BID) | ORAL | Status: DC
  Administered 2022-03-12 – 2022-03-16 (×9): 300 mg via ORAL
  Filled 2022-03-12 (×11): qty 1

## 2022-03-12 MED ORDER — HYDROCOD POLI-CHLORPHE POLI ER 10-8 MG/5ML PO SUER
5.0000 mL | Freq: Two times a day (BID) | ORAL | Status: DC | PRN
  Administered 2022-03-14 – 2022-03-15 (×2): 5 mL via ORAL
  Filled 2022-03-12 (×2): qty 5

## 2022-03-12 MED ORDER — PERFLUTREN LIPID MICROSPHERE: 1.0000 mL | INTRAVENOUS | Status: AC | PRN

## 2022-03-12 MED ORDER — POTASSIUM CHLORIDE CRYS ER 20 MEQ PO TBCR: 40.0000 meq | EXTENDED_RELEASE_TABLET | Freq: Once | ORAL | Status: DC

## 2022-03-12 MED ORDER — INSULIN DETEMIR 100 UNIT/ML ~~LOC~~ SOLN
25.0000 [IU] | Freq: Every day | SUBCUTANEOUS | Status: DC
  Administered 2022-03-13 – 2022-03-14 (×2): 25 [IU] via SUBCUTANEOUS
  Filled 2022-03-12 (×2): qty 0.25

## 2022-03-12 MED ORDER — POTASSIUM CHLORIDE CRYS ER 20 MEQ PO TBCR
40.0000 meq | EXTENDED_RELEASE_TABLET | Freq: Once | ORAL | Status: AC
  Administered 2022-03-12: 40 meq via ORAL
  Filled 2022-03-12: qty 2

## 2022-03-12 MED ORDER — ARTIFICIAL TEARS OPHTHALMIC OINT
TOPICAL_OINTMENT | Freq: Three times a day (TID) | OPHTHALMIC | Status: DC
  Administered 2022-03-15 – 2022-03-16 (×3): 1 via OPHTHALMIC
  Filled 2022-03-12: qty 3.5

## 2022-03-12 NOTE — Progress Notes (Signed)
  Echocardiogram 2D Echocardiogram has been performed.  Wynelle Link 03/12/2022, 5:09 PM

## 2022-03-12 NOTE — Progress Notes (Signed)
PROGRESS NOTE   Patricia Schmitt  TGG:[id number] DOB: 05-26-1966 DOA: 03/10/2022 PCP: Pcp, No  Brief Narrative:  56 year old female known HTN impaired glucose tolerance-never treated for diabetes before Recent exposure to RSV while traveling Developed cough congestion and myalgias and had a fall  Found to be hypothermic tachycardic hypertensive with severe metabolic acidosis hyperglycemia and DKA--sodium 127 potassium 5.2 BUN/creatinine 27/1.7, WBC 18 Admitted by critical care, bolus IV fluid given started on sepsis protocol insulin GTT EDC Acidosis resolved Being treated for COVID without pneumonia and possible otitis  Hospital-Problem based course  Sepsis on admission likely secondary to severe bilateral conjunctivitis and ear infection versus RSV + COVID No oxygen requirement currently We will start Omnicef twice daily for ear infection continue ofloxacin otic drops and add LiquiTears --- I will try to examine ear if I can get a otoscope Explained to her I will take some time for this to recover  Asymptomatic COVID Has cough only inflammatory markers not checked on admission but as asymptomatic do not expect other issues We will give 1 more day of Paxlovid and discontinue Symptomatic management for cough with Tussionex  New onset diabetes mellitus presented with DKA which was severe She will need insulin teaching--diabetic coordinator RN to follow-up Continue moderate sliding scale increase Levemir to 25 units Dosage adjustments as possible over the next several days Start very low-dose lisinopril for renal protection and monitor with labs  Uncontrolled hypertension Continue amlodipine 5-adding lisinopril 5  AKI on admission Metabolic acidosis from DKA/AKI Resolving gradually-force oral fluids DC saline recheck labs a.m.  Hypokalemia hypomagnesemia earlier in hospital stay Now resolved with replacement  Morbid obesity BMI 43   DVT prophylaxis: Heparin Code Status:  Full Family Communication: None present Disposition:  Status is: Inpatient Remains inpatient appropriate because:   Needs insulin teaching still sick etc.   Consultants:  No  Procedures: No  Antimicrobials: Omnicef   Subjective: Quite sleepy has had some liquids-tells me is not a diabetic No chest pain States eyes have been injected like this for 7 days as well as her ear-she has not had any oozing from the ear but I do see some dried fluid around the left side  Objective: Vitals:   03/12/22 0100 03/12/22 0200 03/12/22 0333 03/12/22 0600  BP: 139/83 139/80    Pulse: (!) 122 (!) 102    Resp: (!) 22 (!) 22    Temp:   98.5 F (36.9 C)   TempSrc:   Axillary   SpO2: 92% 93%    Weight:    113.8 kg  Height:        Intake/Output Summary (Last 24 hours) at 03/12/2022 0717 Last data filed at 03/12/2022 0300 Gross per 24 hour  Intake 1861.07 ml  Output 400 ml  Net 1461.07 ml   Filed Weights   03/10/22 1707 03/11/22 0500 03/12/22 0600  Weight: 113.4 kg 114 kg 113.8 kg    Examination:  Slight fluid crust around left ear did not do otoscope he has did not have 1 Neck soft supple Eyes are bilaterally injected ROM intact moving 4 limbs equally no added sounds S1-S2 no murmur sinus, sinus tach Abdomen obese soft nontender no rebound  Data Reviewed: personally reviewed   CBC    Component Value Date/Time   WBC 18.0 (H) 03/11/2022 0722   RBC 5.16 (H) 03/11/2022 0722   HGB 15.4 (H) 03/11/2022 0722   HGB 15.7 09/05/2012 1212   HCT 42.8 03/11/2022 0722   HCT 47.7 (H)  09/05/2012 1212   PLT 201 03/11/2022 0722   PLT 164 09/05/2012 1212   MCV 82.9 03/11/2022 0722   MCV 84 09/05/2012 1212   MCH 29.8 03/11/2022 0722   MCHC 36.0 03/11/2022 0722   RDW 12.4 03/11/2022 0722   RDW 14.3 09/05/2012 1212   LYMPHSABS 1.0 03/10/2022 1712   MONOABS 1.4 (H) 03/10/2022 1712   EOSABS 0.0 03/10/2022 1712   BASOSABS 0.1 03/10/2022 1712      Latest Ref Rng & Units 03/11/2022    12:04 PM 03/11/2022    7:22 AM 03/11/2022   12:03 AM  CMP  Glucose 70 - 99 mg/dL 193  204  318   BUN 6 - 20 mg/dL _0 Creatinine 0.44 - 1.00 mg/dL 0.71  0.91  1.23   Sodium 135 - 145 mmol/L 134  133  134   Potassium 3.5 - 5.1 mmol/L 3.0  3.0  3.6   Chloride 98 - 111 mmol/L 106  108  106   CO2 22 - 32 mmol/L _1 Calcium 8.9 - 10.3 mg/dL 9.0  9.1  9.3   Total Protein 6.5 - 8.1 g/dL  6.2    Total Bilirubin 0.3 - 1.2 mg/dL  0.5    Alkaline Phos 38 - 126 U/L  90    AST 15 - 41 U/L  15    ALT 0 - 44 U/L  17       Radiology Studies: DG Chest Port 1 View  Result Date: 03/10/2022 CLINICAL DATA:  Questionable sepsis EXAM: PORTABLE CHEST 1 VIEW COMPARISON:  None Available. FINDINGS: Cardiomegaly. Both lungs are clear. The visualized skeletal structures are unremarkable. IMPRESSION: Cardiomegaly without acute abnormality of the lungs in AP projection. Electronically Signed   By: Delanna Ahmadi M.D.   On: 03/10/2022 17:30     Scheduled Meds:  amLODipine  5 mg Oral Daily   Chlorhexidine Gluconate Cloth  6 each Topical Daily   heparin  5,000 Units Subcutaneous Q8H   insulin aspart  0-15 Units Subcutaneous TID WC   insulin aspart  0-5 Units Subcutaneous QHS   insulin detemir  20 Units Subcutaneous Daily   insulin starter kit- pen needles  1 kit Other Once   living well with diabetes book   Does not apply Once   nirmatrelvir/ritonavir EUA (renal dosing)  2 tablet Oral BID   ofloxacin  1 drop Both Eyes QID   ofloxacin  5 drop Left EAR Daily   Continuous Infusions:  lactated ringers 50 mL/hr at 03/12/22 0300     LOS: 2 days   Time spent: [place], MD [facility] To contact the attending provider between 7A-7P or the covering provider during after hours 7P-7A, please log into the web site [email or web address] and access using universal [place] password for that web site. If you do not have the password, please call the hospital operator.  03/12/2022,  7:17 AM

## 2022-03-13 DIAGNOSIS — E0811 Diabetes mellitus due to underlying condition with ketoacidosis with coma: Secondary | ICD-10-CM

## 2022-03-13 LAB — BASIC METABOLIC PANEL
Anion gap: 9 (ref 5–15)
BUN: 10 mg/dL (ref 6–20)
CO2: 23 mmol/L (ref 22–32)
Calcium: 8.6 mg/dL — ABNORMAL LOW (ref 8.9–10.3)
Chloride: 106 mmol/L (ref 98–111)
Creatinine, Ser: 0.5 mg/dL (ref 0.44–1.00)
GFR, Estimated: >60 mL/min (ref >60)
Glucose, Bld: 233 mg/dL — ABNORMAL HIGH (ref 70–99)
Potassium: 3.3 mmol/L — ABNORMAL LOW (ref 3.5–5.1)
Sodium: 138 mmol/L (ref 135–145)

## 2022-03-13 LAB — CBC WITH DIFFERENTIAL/PLATELET
Abs Immature Granulocytes: 0.23 10*3/uL — ABNORMAL HIGH (ref 0.00–0.07)
Basophils Absolute: 0.1 10*3/uL (ref 0.0–0.1)
Basophils Relative: 1 %
Eosinophils Absolute: 0 10*3/uL (ref 0.0–0.5)
Eosinophils Relative: 0 %
HCT: 38.7 % (ref 36.0–46.0)
Hemoglobin: 14.1 g/dL (ref 12.0–15.0)
Immature Granulocytes: 2 %
Lymphocytes Relative: 19 %
Lymphs Abs: 2 10*3/uL (ref 0.7–4.0)
MCH: 29.9 pg (ref 26.0–34.0)
MCHC: 36.4 g/dL — ABNORMAL HIGH (ref 30.0–36.0)
MCV: 82 fL (ref 80.0–100.0)
Monocytes Absolute: 1.2 10*3/uL — ABNORMAL HIGH (ref 0.1–1.0)
Monocytes Relative: 11 %
Neutro Abs: 7.1 10*3/uL (ref 1.7–7.7)
Neutrophils Relative %: 67 %
Platelets: 217 10*3/uL (ref 150–400)
RBC: 4.72 MIL/uL (ref 3.87–5.11)
RDW: 12.9 % (ref 11.5–15.5)
WBC: 10.5 10*3/uL (ref 4.0–10.5)
nRBC: 0 % (ref 0.0–0.2)

## 2022-03-13 LAB — GLUCOSE, CAPILLARY
Glucose-Capillary: 248 mg/dL — ABNORMAL HIGH (ref 70–99)
Glucose-Capillary: 251 mg/dL — ABNORMAL HIGH (ref 70–99)
Glucose-Capillary: 207 mg/dL — ABNORMAL HIGH (ref 70–99)

## 2022-03-13 MED ORDER — METFORMIN HCL 500 MG PO TABS
500.0000 mg | ORAL_TABLET | Freq: Two times a day (BID) | ORAL | Status: DC
  Administered 2022-03-13 – 2022-03-16 (×5): 500 mg via ORAL
  Filled 2022-03-13 (×5): qty 1

## 2022-03-13 MED ORDER — POTASSIUM CHLORIDE CRYS ER 20 MEQ PO TBCR
40.0000 meq | EXTENDED_RELEASE_TABLET | Freq: Two times a day (BID) | ORAL | Status: DC
  Administered 2022-03-13 – 2022-03-14 (×3): 40 meq via ORAL
  Filled 2022-03-13 (×3): qty 2

## 2022-03-13 NOTE — TOC Initial Note (Addendum)
Transition of Care [facility]) - Initial/Assessment Note    Patient Details  Name: Patricia Schmitt MRN: [id number] Date of Birth: [birth year]  Transition of Care [facility]) CM/SW Contact:    Patricia Labrum, RN Phone Number: 03/13/2022, 2:01 PM  Clinical Narrative:                 CM met with the patient at the bedside to discuss transitions of care needs for home.  The patient was admitted for COVID and DKA.  The patient lives at home with her Patricia Schmitt and will need PCP follow up for post-hospitalization follow up.  I was unable to find a Patricia Schmitt that had new patient availability in the [place] or [place] area.  I called Patricia Schmitt and Patricia Patten, MD was set up as the patient PCP provider.  The patient was placed on the waiting list for be worked into the Schmitt schedule in the next 2 weeks for a hospital follow up and the Schmitt will follow up with the patient to schedule.  However, an appointment was scheduled for May 01, 2022 at 2:30 at this time but appointment will be moved to an earlier time through the Schmitt.  Patient is scheduled for a hospital follow up at Patricia Schmitt on 10/27/ 2023 at 10 am with Patricia Schmitt at 10 am.  I spoke with the patient and she is agreeable to home Schmitt services for PT/OT but did not have a preference for companies.  I called [place] and spoke with [facility] and she accepted the patient for PT/OT services.  Home Schmitt orders placed.  DME - The patient will need 3:1 and PT notes are pending at this time since the patient was nauseated this morning.  Transportation - The patient will have friend/family provide transportation.  The patient states that her Patricia Schmitt was sick with COVID as well.  CM will continue to follow the patient for Patricia Schmitt needs for home - not medically stable for discharge at this time.  The patient will have diabetic teaching by nursing staff and meds provided prior to  discharge to home.  Expected Discharge Plan: [place] Barriers to Discharge: Continued Medical Work up   Patient Goals and CMS Choice Patient states their goals for this hospitalization and ongoing recovery are:: To return home CMS Medicare.gov Compare Post Acute Care list provided to:: Patient Choice offered to / list presented to : Patient  Expected Discharge Plan and Services Expected Discharge Plan: Patricia Schmitt In-house Referral: PCP / [facility] Discharge Planning Services: CM Consult, Follow-up appt scheduled   Living arrangements for the past 2 months: Single Family Home                           HH Arranged: PT, OT Patricia Schmitt Agency:  (Patricia Schmitt) Date [place]: 03/13/22 Time [place]: 2542 Representative spoke with at [place]: Patricia Schmitt, [place] with Patricia [place]  Prior Living Arrangements/Services Living arrangements for the past 2 months: [place] with:: Spouse Patient language and need for interpreter reviewed:: Yes Do you feel safe going back to the place where you live?: Yes      Need for Family Participation in Patient Care: Yes (Comment) Care giver support system in place?: Yes (comment)   Criminal Activity/Legal Involvement Pertinent to Current Situation/Hospitalization: No - Comment as needed  Activities of Daily Living Home Assistive Devices/Equipment: None ADL Screening (condition at time of admission) Patient's cognitive ability adequate to safely complete daily activities?: Yes Is the patient deaf or have difficulty hearing?: No Does the patient have difficulty seeing, even when wearing glasses/contacts?: No Does the patient have difficulty concentrating, remembering, or making decisions?: No Patient able to express need for assistance with ADLs?: No Does the patient have difficulty dressing or bathing?: No Independently performs ADLs?: Yes (appropriate for  developmental age) Does the patient have difficulty walking or climbing stairs?: No Weakness of Legs: None Weakness of Arms/Hands: None  Permission Sought/Granted Permission sought to share information with : Case Manager Permission granted to share information with : Yes, Verbal Permission Granted     Permission granted to share info w AGENCY: Patricia Schmitt agency, PCP follow up - Patricia and [place] granted to share info w Relationship: spouse Patricia Schmitt - [phone]     Emotional Assessment Appearance:: Appears stated age Attitude/Demeanor/Rapport: Lethargic Affect (typically observed): Accepting Orientation: : Oriented to Self, Oriented to Place, Oriented to  Time, Oriented to Situation Alcohol / Substance Use: Not Applicable Psych Involvement: No (comment)  Admission diagnosis:  DKA (diabetic ketoacidosis) ([place]) [E11.10] Diabetic ketoacidosis without coma associated with type 2 diabetes mellitus ([place]) [E11.10] Sepsis with acute renal failure without septic shock, due to unspecified organism, unspecified acute renal failure type ([place]) [A41.9, R65.20, N17.9] Patient Active Problem List   Diagnosis Date Noted   COVID-19 virus infection 03/11/2022   DKA (diabetic ketoacidosis) ([place]) 03/10/2022   Dizzy 11/04/2012   Palpitations 11/04/2012   Type II or unspecified type diabetes mellitus without mention of complication, not stated as uncontrolled 11/04/2012   Papules 11/04/2012   Dyspnea 09/30/2012   Abdominal pain, unspecified site 09/29/2012   Migraine without aura, with intractable migraine, so stated, without mention of status migrainosus 09/18/2012   PCP:  Pcp, No Pharmacy:   [facility] [place] [place] [facility] [place] [address] [place] [address] Phone: [phone] Fax: [phone]  [facility] [address] [place] [address] Phone: [phone] Fax: [phone]     Social  Determinants of Schmitt (SDOH) Interventions    Readmission Risk Interventions     No data to display

## 2022-03-13 NOTE — Progress Notes (Signed)
PROGRESS NOTE   ARLYNN STARE  BMW:[id number] DOB: Oct 03, 1965 DOA: 03/10/2022 PCP: Pcp, No  Brief Narrative:  56 year old female known HTN impaired glucose tolerance-never treated for diabetes before Recent exposure to RSV while traveling Developed cough congestion and myalgias and had a fall  Found to be hypothermic tachycardic hypertensive with severe metabolic acidosis hyperglycemia and DKA--sodium 127 potassium 5.2 BUN/creatinine 27/1.7, WBC 18 Admitted by critical care, bolus IV fluid given started on sepsis protocol insulin GTT EDC Acidosis resolved Being treated for COVID without pneumonia and possible otitis  Hospital-Problem based course  Sepsis on admission likely secondary to severe bilateral conjunctivitis and ear infection versus RSV + COVID No oxygen requirement currently Continue Omnicef twice daily for ear infection continue ofloxacin otic drops and add LiquiTears --area examined as below  Asymptomatic COVID Asymptomatic oxygen requirement main issue is that patient is tired Discontinue Paxlovid 10/17 Symptomatic management for cough with Tussionex  New onset diabetes mellitus presented with DKA which was severe She will need insulin teaching--patient has not given herself any insulin today as confirmed with RN diabetic coordinator RN to follow-up Continue moderate sliding scale increase Levemir to 25 units-sugars 200-250 continue sliding scale in addition starting metformin 500 twice daily Lisinopril 5 for renal protection  Uncontrolled hypertension Continue amlodipine 5-adding lisinopril 5  AKI on admission Metabolic acidosis from DKA/AKI Resolved-saline lock  Hypokalemia hypomagnesemia earlier in hospital stay Replace potassium 40 mEq twice daily  Morbid obesity BMI 43   DVT prophylaxis: Heparin Code Status: Full Family Communication: None present Disposition:  Status is: Inpatient Remains inpatient appropriate because:   Needs insulin  teaching still sick etc.   Consultants:  No  Procedures: No  Antimicrobials: Omnicef   Subjective:  Sleepy but arousable-still feels quite weak Eyes and ears feel a little better she is getting the drops She has no chest pain or fever She has not been herself insulin yet according to nursing staff  Objective: Vitals:   03/13/22 0348 03/13/22 0834 03/13/22 1202 03/13/22 1541  BP: (!) 150/75 (!) 164/95 (!) 165/96 (!) 148/88  Pulse: 98 96 94 100  Resp: '18 16 16 15  ' Temp: 98.4 F (36.9 C) 98.1 F (36.7 C) 98.5 F (36.9 C) 98.5 F (36.9 C)  TempSrc:  Oral Oral Oral  SpO2: 95% 98% 97% 97%  Weight:      Height:        Intake/Output Summary (Last 24 hours) at 03/13/2022 1607 Last data filed at 03/13/2022 1545 Gross per 24 hour  Intake 1574.06 ml  Output 1900 ml  Net -325.94 ml    Filed Weights   03/11/22 0500 03/12/22 0600 03/13/22 0323  Weight: 114 kg 113.8 kg 113.5 kg    Examination:  Scleral injection seems to be improved  Right ear seems to have air-fluid level left ear view is obscured by drops that have been placed and I am not able to clearly see the tympanic membrane ROM intact moving 4 limbs equally no added sounds S1-S2 no murmur sinus, sinus tach Abdomen obese soft nontender no rebound  Data Reviewed: personally reviewed   CBC    Component Value Date/Time   WBC 10.5 03/13/2022 0332   RBC 4.72 03/13/2022 0332   HGB 14.1 03/13/2022 0332   HGB 15.7 09/05/2012 1212   HCT 38.7 03/13/2022 0332   HCT 47.7 (H) 09/05/2012 1212   PLT 217 03/13/2022 0332   PLT 164 09/05/2012 1212   MCV 82.0 03/13/2022 0332   MCV 84 09/05/2012 1212  MCH 29.9 03/13/2022 0332   MCHC 36.4 (H) 03/13/2022 0332   RDW 12.9 03/13/2022 0332   RDW 14.3 09/05/2012 1212   LYMPHSABS 2.0 03/13/2022 0332   MONOABS 1.2 (H) 03/13/2022 0332   EOSABS 0.0 03/13/2022 0332   BASOSABS 0.1 03/13/2022 0332      Latest Ref Rng & Units 03/13/2022    3:32 AM 03/12/2022    5:52 AM  03/11/2022   12:04 PM  CMP  Glucose 70 - 99 mg/dL 233  265  193   BUN 6 - 20 mg/dL '10  13  17   ' Creatinine 0.44 - 1.00 mg/dL 0.50  0.76  0.71   Sodium 135 - 145 mmol/L 138  136  134   Potassium 3.5 - 5.1 mmol/L 3.3  3.4  3.0   Chloride 98 - 111 mmol/L 106  106  106   CO2 22 - 32 mmol/L '23  18  19   ' Calcium 8.9 - 10.3 mg/dL 8.6  8.6  9.0      Radiology Studies: ECHOCARDIOGRAM COMPLETE  Result Date: 03/12/2022    ECHOCARDIOGRAM REPORT   Patient Name:   REGANA KEMPLE Date of Exam: 03/12/2022 Medical Rec #:  [id number]        Height:       64.0 in Accession #:    [id number]       Weight:       250.9 lb Date of Birth:  1965-12-17        BSA:          2.154 m Patient Age:    55 years         BP:           159/100 mmHg Patient Gender: F                HR:           106 bpm. Exam Location:  Inpatient Procedure: 2D Echo, Cardiac Doppler and Color Doppler Indications:    Cardiomegaly I51.7  History:        Patient has no prior history of Echocardiogram examinations.                 COVID, Signs/Symptoms:Dyspnea; Risk Factors:Diabetes, Non-Smoker                 and Hypertension.  Sonographer:    Greer Pickerel Referring Phys: [id number] Candee Furbish  Sonographer Comments: Technically difficult study due to poor echo windows, Technically challenging study due to limited acoustic windows, patient is obese, suboptimal subcostal window, suboptimal parasternal window and suboptimal apical window. Image acquisition challenging due to respiratory motion. Patient refused definity. IMPRESSIONS  1. Left ventricular ejection fraction, by estimation, is 60 to 65%. The left ventricle has normal function. The left ventricle has no regional wall motion abnormalities. There is mild concentric left ventricular hypertrophy. Left ventricular diastolic parameters are consistent with Grade I diastolic dysfunction (impaired relaxation).  2. Right ventricular systolic function is normal. The right ventricular size is normal.  Tricuspid regurgitation signal is inadequate for assessing PA pressure.  3. The mitral valve is normal in structure. Trivial mitral valve regurgitation.  4. The aortic valve is tricuspid. Aortic valve regurgitation is not visualized. No aortic stenosis is present.  5. The inferior vena cava is normal in size with greater than 50% respiratory variability, suggesting right atrial pressure of 3 mmHg. Comparison(s): No prior Echocardiogram. FINDINGS  Left Ventricle: Left ventricular ejection fraction, by estimation, is 60  to 65%. The left ventricle has normal function. The left ventricle has no regional wall motion abnormalities. The left ventricular internal cavity size was normal in size. There is  mild concentric left ventricular hypertrophy. Left ventricular diastolic parameters are consistent with Grade I diastolic dysfunction (impaired relaxation). Right Ventricle: The right ventricular size is normal. No increase in right ventricular wall thickness. Right ventricular systolic function is normal. Tricuspid regurgitation signal is inadequate for assessing PA pressure. Left Atrium: Left atrial size was normal in size. Right Atrium: Right atrial size was normal in size. Pericardium: There is no evidence of pericardial effusion. Mitral Valve: The mitral valve is normal in structure. Trivial mitral valve regurgitation. Tricuspid Valve: The tricuspid valve is normal in structure. Tricuspid valve regurgitation is trivial. Aortic Valve: The aortic valve is tricuspid. Aortic valve regurgitation is not visualized. No aortic stenosis is present. Pulmonic Valve: The pulmonic valve was normal in structure. Pulmonic valve regurgitation is trivial. Aorta: The aortic root and ascending aorta are structurally normal, with no evidence of dilitation. Venous: The inferior vena cava is normal in size with greater than 50% respiratory variability, suggesting right atrial pressure of 3 mmHg. IAS/Shunts: The atrial septum is grossly  normal.  LEFT VENTRICLE PLAX 2D LVIDd:         4.10 cm   Diastology LVIDs:         2.70 cm   LV e' medial:    6.96 cm/s LV PW:         0.90 cm   LV E/e' medial:  10.5 LV IVS:        0.90 cm   LV e' lateral:   5.87 cm/s LVOT diam:     1.70 cm   LV E/e' lateral: 12.4 LV SV:         51 LV SV Index:   24 LVOT Area:     2.27 cm  LEFT ATRIUM           Index        RIGHT ATRIUM           Index LA Vol (A2C): 30.1 ml 13.97 ml/m  RA Area:     13.60 cm LA Vol (A4C): 56.5 ml 26.23 ml/m  RA Volume:   29.90 ml  13.88 ml/m  AORTIC VALVE LVOT Vmax:   152.00 cm/s LVOT Vmean:  99.400 cm/s LVOT VTI:    0.225 m  AORTA Ao Root diam: 3.10 cm Ao Asc diam:  2.95 cm MITRAL VALVE MV Area (PHT): 4.96 cm    SHUNTS MV Decel Time: 153 msec    Systemic VTI:  0.22 m MV E velocity: 72.80 cm/s  Systemic Diam: 1.70 cm MV A velocity: 99.40 cm/s MV E/A ratio:  0.73 Gwyndolyn Kaufman MD Electronically signed by Gwyndolyn Kaufman MD Signature Date/Time: 03/12/2022/7:08:33 PM    Final      Scheduled Meds:  amLODipine  5 mg Oral Daily   artificial tears   Both Eyes Q8H   cefdinir  300 mg Oral Q12H   Chlorhexidine Gluconate Cloth  6 each Topical Daily   heparin  5,000 Units Subcutaneous Q8H   insulin aspart  0-15 Units Subcutaneous TID WC   insulin aspart  0-5 Units Subcutaneous QHS   insulin detemir  25 Units Subcutaneous Daily   insulin starter kit- pen needles  1 kit Other Once   lisinopril  5 mg Oral Daily   living well with diabetes book   Does not apply Once  nirmatrelvir/ritonavir EUA (renal dosing)  2 tablet Oral BID   ofloxacin  1 drop Both Eyes QID   ofloxacin  5 drop Left EAR Daily   potassium chloride  40 mEq Oral BID   Continuous Infusions:     LOS: 3 days   Time spent: [place], MD [facility] To contact the attending provider between 7A-7P or the covering provider during after hours 7P-7A, please log into the web site [email or web address] and access using universal [place] password for  that web site. If you do not have the password, please call the hospital operator.  03/13/2022, 4:07 PM

## 2022-03-13 NOTE — Progress Notes (Signed)
Patient educated on insulin injection. Demonstrated procedure successfully. Patient tolerated procedure well.

## 2022-03-13 NOTE — Discharge Instructions (Addendum)
Plate Method for Diabetes   Foods with carbohydrates make your blood glucose level go up. The plate method is a simple way to meal plan and control the amount of carbohydrate you eat.         Use the following guidance to build a healthy plate to control carbohydrates. Divide a 9-inch plate into 3 sections, and consider your beverage the 4th section of your meal: Food Group Examples of Foods/Beverages for This Section of your Meal  Section 1: Non-starchy vegetables Fill  of your plate to include non-starchy vegetables Asparagus, broccoli, brussels sprouts, cabbage, carrots, cauliflower, celery, cucumber, green beans, mushrooms, peppers, salad greens, tomatoes, or zucchini.  Section 2: Protein foods Fill  of your plate to include a lean protein Lean meat, poultry, fish, seafood, cheese, eggs, lean deli meat, tofu, beans, lentils, nuts or nut butters.  Section 3: Carbohydrate foods Fill  of your plate to include carbohydrate foods Whole grains, whole wheat bread, brown rice, whole grain pasta, polenta, corn tortillas, fruit, or starchy vegetables (potatoes, green peas, corn, beans, acorn squash, and butternut squash). One cup of milk also counts as a food that contains carbohydrate.  Section 4: Beverage Choose water or a low-calorie drink for your beverage. Unsweetened tea, coffee, or flavored/sparkling water without added sugar.  Image reprinted with permission from The American Diabetes Association.  Copyright 2022 by the American Diabetes Association.   Copyright 2022  Academy of Nutrition and Dietetics. All rights reserved    Carbohydrate Counting For People With Diabetes  Foods with carbohydrates make your blood glucose level go up. Learning how to count carbohydrates can help you control your blood glucose levels. First, identify the foods you eat that contain carbohydrates. Then, using the Foods with Carbohydrates chart, determine about how much carbohydrates are in your meals and  snacks. Make sure you are eating foods with fiber, protein, and healthy fat along with your carbohydrate foods. Foods with Carbohydrates The following table shows carbohydrate foods that have about 15 grams of carbohydrate each. Using measuring cups, spoons, or a food scale when you first begin learning about carbohydrate counting can help you learn about the portion sizes you typically eat. The following foods have 15 grams carbohydrate each:  Grains 1 slice bread (1 ounce)  1 small tortilla (6-inch size)   large bagel (1 ounce)  1/3 cup pasta or rice (cooked)   hamburger or hot dog bun ( ounce)   cup cooked cereal   to  cup ready-to-eat cereal  2 taco shells (5-inch size) Fruit 1 small fresh fruit ( to 1 cup)   medium banana  17 small grapes (3 ounces)  1 cup melon or berries   cup canned or frozen fruit  2 tablespoons dried fruit (blueberries, cherries, cranberries, raisins)   cup unsweetened fruit juice  Starchy Vegetables  cup cooked beans, peas, corn, potatoes/sweet potatoes   large baked potato (3 ounces)  1 cup acorn or butternut squash  Snack Foods 3 to 6 crackers  8 potato chips or 13 tortilla chips ( ounce to 1 ounce)  3 cups popped popcorn  Dairy 3/4 cup (6 ounces) nonfat plain yogurt, or yogurt with sugar-free sweetener  1 cup milk  1 cup plain rice, soy, coconut or flavored almond milk Sweets and Desserts  cup ice cream or frozen yogurt  1 tablespoon jam, jelly, pancake syrup, table sugar, or honey  2 tablespoons light pancake syrup  1 inch square of frosted cake or 2 inch square of   unfrosted cake  2 small cookies (2/3 ounce each) or  large cookie  Sometimes you'll have to estimate carbohydrate amounts if you don't know the exact recipe. One cup of mixed foods like soups can have 1 to 2 carbohydrate servings, while some casseroles might have 2 or more servings of carbohydrate. Foods that have less than 20 calories in each serving can be counted as  "free" foods. Count 1 cup raw vegetables, or  cup cooked non-starchy vegetables as "free" foods. If you eat 3 or more servings at one meal, then count them as 1 carbohydrate serving.  Foods without Carbohydrates  Not all foods contain carbohydrates. Meat, some dairy, fats, non-starchy vegetables, and many beverages don't contain carbohydrate. So when you count carbohydrates, you can generally exclude chicken, pork, beef, fish, seafood, eggs, tofu, cheese, butter, sour cream, avocado, nuts, seeds, olives, mayonnaise, water, black coffee, unsweetened tea, and zero-calorie drinks. Vegetables with no or low carbohydrate include green beans, cauliflower, tomatoes, and onions. How much carbohydrate should I eat at each meal?  Carbohydrate counting can help you plan your meals and manage your weight. Following are some starting points for carbohydrate intake at each meal. Work with your registered dietitian nutritionist to find the best range that works for your blood glucose and weight.   To Lose Weight To Maintain Weight  Women 2 - 3 carb servings 3 - 4 carb servings  Men 3 - 4 carb servings 4 - 5 carb servings  Checking your blood glucose after meals will help you know if you need to adjust the timing, type, or number of carbohydrate servings in your meal plan. Achieve and keep a healthy body weight by balancing your food intake and physical activity.  Tips How should I plan my meals?  Plan for half the food on your plate to include non-starchy vegetables, like salad greens, broccoli, or carrots. Try to eat 3 to 5 servings of non-starchy vegetables every day. Have a protein food at each meal. Protein foods include chicken, fish, meat, eggs, or beans (note that beans contain carbohydrate). These two food groups (non-starchy vegetables and proteins) are low in carbohydrate. If you fill up your plate with these foods, you will eat less carbohydrate but still fill up your stomach. Try to limit your carbohydrate  portion to  of the plate.  What fats are healthiest to eat?  Diabetes increases risk for heart disease. To help protect your heart, eat more healthy fats, such as olive oil, nuts, and avocado. Eat less saturated fats like butter, cream, and high-fat meats, like bacon and sausage. Avoid trans fats, which are in all foods that list "partially hydrogenated oil" as an ingredient. What should I drink?  Choose drinks that are not sweetened with sugar. The healthiest choices are water, carbonated or seltzer waters, and tea and coffee without added sugars.  Sweet drinks will make your blood glucose go up very quickly. One serving of soda or energy drink is  cup. It is best to drink these beverages only if your blood glucose is low.  Artificially sweetened, or diet drinks, typically do not increase your blood glucose if they have zero calories in them. Read labels of beverages, as some diet drinks do have carbohydrate and will raise your blood glucose. Label Reading Tips Read Nutrition Facts labels to find out how many grams of carbohydrate are in a food you want to eat. Don't forget: sometimes serving sizes on the label aren't the same as how much food   you are going to eat, so you may need to calculate how much carbohydrate is in the food you are serving yourself.   Carbohydrate Counting for People with Diabetes Sample 1-Day Menu  Breakfast  cup yogurt, low fat, low sugar (1 carbohydrate serving)   cup cereal, ready-to-eat, unsweetened (1 carbohydrate serving)  1 cup strawberries (1 carbohydrate serving)   cup almonds ( carbohydrate serving)  Lunch 1, 5 ounce can chunk light tuna  2 ounces cheese, low fat cheddar  6 whole wheat crackers (1 carbohydrate serving)  1 small apple (1 carbohydrate servings)   cup carrots ( carbohydrate serving)   cup snap peas  1 cup 1% milk (1 carbohydrate serving)   Evening Meal Stir fry made with: 3 ounces chicken  1 cup brown rice (3 carbohydrate servings)    cup broccoli ( carbohydrate serving)   cup green beans   cup onions  1 tablespoon olive oil  2 tablespoons teriyaki sauce ( carbohydrate serving)  Evening Snack 1 extra small banana (1 carbohydrate serving)  1 tablespoon peanut butter   Carbohydrate Counting for People with Diabetes Vegan Sample 1-Day Menu  Breakfast 1 cup cooked oatmeal (2 carbohydrate servings)   cup blueberries (1 carbohydrate serving)  2 tablespoons flaxseeds  1 cup soymilk fortified with calcium and vitamin D  1 cup coffee  Lunch 2 slices whole wheat bread (2 carbohydrate servings)   cup baked tofu   cup lettuce  2 slices tomato  2 slices avocado   cup baby carrots ( carbohydrate serving)  1 orange (1 carbohydrate serving)  1 cup soymilk fortified with calcium and vitamin D   Evening Meal Burrito made with: 1 6-inch corn tortilla (1 carbohydrate serving)  1 cup refried vegetarian beans (2 carbohydrate servings)   cup chopped tomatoes   cup lettuce   cup salsa  1/3 cup brown rice (1 carbohydrate serving)  1 tablespoon olive oil for rice   cup zucchini   Evening Snack 6 small whole grain crackers (1 carbohydrate serving)  2 apricots ( carbohydrate serving)   cup unsalted peanuts ( carbohydrate serving)    Carbohydrate Counting for People with Diabetes Vegetarian (Lacto-Ovo) Sample 1-Day Menu  Breakfast 1 cup cooked oatmeal (2 carbohydrate servings)   cup blueberries (1 carbohydrate serving)  2 tablespoons flaxseeds  1 egg  1 cup 1% milk (1 carbohydrate serving)  1 cup coffee  Lunch 2 slices whole wheat bread (2 carbohydrate servings)  2 ounces low-fat cheese   cup lettuce  2 slices tomato  2 slices avocado   cup baby carrots ( carbohydrate serving)  1 orange (1 carbohydrate serving)  1 cup unsweetened tea  Evening Meal Burrito made with: 1 6-inch corn tortilla (1 carbohydrate serving)   cup refried vegetarian beans (1 carbohydrate serving)   cup tomatoes   cup lettuce    cup salsa  1/3 cup brown rice (1 carbohydrate serving)  1 tablespoon olive oil for rice   cup zucchini  1 cup 1% milk (1 carbohydrate serving)  Evening Snack 6 small whole grain crackers (1 carbohydrate serving)  2 apricots ( carbohydrate serving)   cup unsalted peanuts ( carbohydrate serving)    Copyright 2020  Academy of Nutrition and Dietetics. All rights reserved.  Using Nutrition Labels: Carbohydrate  Serving Size  Look at the serving size. All the information on the label is based on this portion. Servings Per Container  The number of servings contained in the package. Guidelines for Carbohydrate  Look at   the total grams of carbohydrate in the serving size.  1 carbohydrate choice = 15 grams of carbohydrate. Range of Carbohydrate Grams Per Choice  Carbohydrate Grams/Choice Carbohydrate Choices  6-10   11-20 1  21-25 1  26-35 2  36-40 2  41-50 3  51-55 3  56-65 4  66-70 4  71-80 5    Copyright 2020  Academy of Nutrition and Dietetics. All rights reserved.   Additional Discharge Instructions   Please get your medications reviewed and adjusted by your Primary MD.  Please request your Primary MD to go over all Hospital Tests and Procedure/Radiological results at the follow up, please get all Hospital records sent to your Prim MD by signing hospital release before you go home.  If you had Pneumonia of Lung problems at the Hospital: Please get a 2 view Chest X ray done in approximately 4 weeks after hospital discharge or sooner if instructed by your Primary MD.  If you have Congestive Heart Failure: Please call your Cardiologist or Primary MD anytime you have any of the following symptoms:  1) 3 pound weight gain in 24 hours or 5 pounds in 1 week  2) shortness of breath, with or without a dry hacking cough  3) swelling in the hands, feet or stomach  4) if you have to sleep on extra pillows at night in order to breathe  Follow cardiac low salt diet and  1.5 lit/day fluid restriction.  If you have diabetes Accuchecks 4 times/day, Once in AM empty stomach and then before each meal. Log in all results and show them to your primary doctor at your next visit. If any glucose reading is under 80 or above 300 call your primary MD immediately.  If you have Seizure/Convulsions/Epilepsy: Please do not drive, operate heavy machinery, participate in activities at heights or participate in high speed sports until you have seen by Primary MD or a Neurologist and advised to do so again. Per [place] DMV statutes, patients with seizures are not allowed to drive until they have been seizure-free for six months.  Use caution when using heavy equipment or power tools. Avoid working on ladders or at heights. Take showers instead of baths. Ensure the water temperature is not too high on the home water heater. Do not go swimming alone. Do not lock yourself in a room alone (i.e. bathroom). When caring for infants or small children, sit down when holding, feeding, or changing them to minimize risk of injury to the child in the event you have a seizure. Maintain good sleep hygiene. Avoid alcohol.   If you had Gastrointestinal Bleeding: Please ask your Primary MD to check a complete blood count within one week of discharge or at your next visit. Your endoscopic/colonoscopic biopsies that are pending at the time of discharge, will also need to followed by your Primary MD.  Get Medicines reviewed and adjusted. Please take all your medications with you for your next visit with your Primary MD  Please request your Primary MD to go over all hospital tests and procedure/radiological results at the follow up, please ask your Primary MD to get all Hospital records sent to his/her office.  If you experience worsening of your admission symptoms, develop shortness of breath, life threatening emergency, suicidal or homicidal thoughts you must seek medical attention immediately  by calling 911 or calling your MD immediately  if symptoms less severe.  You must read complete instructions/literature along with all the possible adverse reactions/side effects for   all the Medicines you take and that have been prescribed to you. Take any new Medicines after you have completely understood and accpet all the possible adverse reactions/side effects.   Do not drive or operate heavy machinery when taking Pain medications.   Do not take more than prescribed Pain, Sleep and Anxiety Medications  Special Instructions: If you have smoked or chewed Tobacco  in the last 2 yrs please stop smoking, stop any regular Alcohol  and or any Recreational drug use.  Wear Seat belts while driving.  Please note You were cared for by a hospitalist during your hospital stay. If you have any questions about your discharge medications or the care you received while you were in the hospital after you are discharged, you can call the unit and asked to speak with the hospitalist on call if the hospitalist that took care of you is not available. Once you are discharged, your primary care physician will handle any further medical issues. Please note that NO REFILLS for any discharge medications will be authorized once you are discharged, as it is imperative that you return to your primary care physician (or establish a relationship with a primary care physician if you do not have one) for your aftercare needs so that they can reassess your need for medications and monitor your lab values.  You can reach the hospitalist office at phone [phone] or fax [phone]   If you do not have a primary care physician, you can call [phone] for a physician referral.  

## 2022-03-13 NOTE — Progress Notes (Signed)
Nutrition Brief Note  RD consult received for new DM diet education. Nutrition education handouts added to AVS and referral placed for ongoing outpatient diabetes education.   Body mass index is 42.95 kg/m.   Lab Results  Component Value Date   HGBA1C 14.0 (H) 03/10/2022   Current diet order is Carb modified, patient is consuming approximately 90% of meals at this time. Labs and medications reviewed.   If additional nutrition issues arise, please re-consult RD.   Clayborne Dana, RDN, LDN Clinical Nutrition

## 2022-03-13 NOTE — Inpatient Diabetes Management (Signed)
Inpatient Diabetes Program Recommendations  AACE/ADA: New Consensus Statement on Inpatient Glycemic Control (2015)  Target Ranges:  Prepandial:   less than 140 mg/dL      Peak postprandial:   less than 180 mg/dL (1-2 hours)      Critically ill patients:  140 - 180 mg/dL    Latest Reference Range & Units 03/10/22 20:40  Hemoglobin A1C 4.8 - 5.6 % 14.0 (H)  355 mg/dl  (H): Data is abnormally high  Latest Reference Range & Units 03/10/22 17:12  Glucose 70 - 99 mg/dL 697 (HH)  (HH): Data is critically high  Latest Reference Range & Units 03/12/22 07:45 03/12/22 11:51 03/12/22 16:36 03/12/22 22:13  Glucose-Capillary 70 - 99 mg/dL 283 (H)  8 units Novolog  305 (H)  11 units Novolog  20 units Semglee @1018   236 (H)  5 units Novolog  164 (H)  (H): Data is abnormally high   Admit with:  Sepsis on admission likely secondary to severe bilateral conjunctivitis and ear infection versus RSV + COVID New onset diabetes mellitus presented with DKA which was severe Treated with IVF and IV Insulin Transitioned to SQ Insulin 10/15    Current Orders: Levemir 25 units daily     Novolog Moderate Correction Scale/ SSI (0-15 units) TID AC + HS    Note Levemir increased to 25 units Daily this AM  Do not see that pt has PCP listed--Have placed TOC consult  RD consult ordered for DM diet education.  Have asked RNs caring for pt to show and allow pt to self-administer all insulin injections for practice  Diabetes RN will plan to see pt today    --Will follow patient during hospitalization--  Wyn Quaker RN, MSN, [employer] Diabetes Coordinator Inpatient Glycemic Control Team Team Pager: [phone] (8a-5p)

## 2022-03-13 NOTE — Evaluation (Signed)
Physical Therapy Evaluation Patient Details Name: Patricia Schmitt MRN: [id number] DOB: 1965/09/07 Today's Date: 03/13/2022  History of Present Illness  56 year old female admitted 03/10/22 with HTN , RSV+COVID, cough, myalgias and had a fall. Dx with sepsis, New onset diabetes mellitus presented with DKA which was severe, Metabolic acidosis from DKA/AKI. PMH includes: Hypertension, Meningitis spinal, Obesity, Abdominal hysterectomy (2007).  Clinical Impression   Pt presents with generalized weakness, dizziness with head movement (+ room-spinning dizziness with modified dix hallpike towards R), impaired activity tolerance. Pt to benefit from acute PT to address deficits. Pt declined all OOB mobility, states "they've messed with me too much already today, I need to rest". Pt allowed supine strength assessment, initial vestibular assessment at this time. PT to see pt tomorrow to progress mobility, pt states she will be agreeable to further vestibular and mobility assessment at that time. PT to progress mobility as tolerated, and will continue to follow acutely.         Recommendations for follow up therapy are one component of a multi-disciplinary discharge planning process, led by the attending physician.  Recommendations may be updated based on patient status, additional functional criteria and insurance authorization.  Follow Up Recommendations Home health PT      Assistance Recommended at Discharge Frequent or constant Supervision/Assistance  Patient can return home with the following  A lot of help with walking and/or transfers;A lot of help with bathing/dressing/bathroom    Equipment Recommendations None recommended by PT  Recommendations for Other Services       Functional Status Assessment Patient has had a recent decline in their functional status and demonstrates the ability to make significant improvements in function in a reasonable and predictable amount of time.      Precautions / Restrictions Precautions Precautions: Fall Precaution Comments: COVID Restrictions Weight Bearing Restrictions: No      Mobility  Bed Mobility               General bed mobility comments: pt declines    Transfers                        Ambulation/Gait                  Stairs            Wheelchair Mobility    Modified Rankin (Stroke Patients Only)       Balance Overall balance assessment: Needs assistance     Sitting balance - Comments: pt declines       Standing balance comment: pt declines                             Pertinent Vitals/Pain Pain Assessment Pain Assessment: No/denies pain    Home Living Family/patient expects to be discharged to:: Private residence Living Arrangements: Spouse/significant other (dog) Available Help at Discharge: Family;Available PRN/intermittently Type of Home: House Home Access: Stairs to enter Entrance Stairs-Rails: None Entrance Stairs-Number of Steps: 1   Home Layout: Multi-level;Able to live on main level with bedroom/bathroom Home Equipment: Shower seat - built in;Hand held shower head Additional Comments: pharm. raw materials rep    Prior Function Prior Level of Function : Independent/Modified Independent                     Hand Dominance   Dominant Hand: Right    Extremity/Trunk Assessment   Upper Extremity Assessment Upper Extremity  Assessment: Defer to OT evaluation    Lower Extremity Assessment Lower Extremity Assessment: Generalized weakness (able to perform heel slides, hip abd/add in supine, declines further testing that would require her sitting up)       Communication   Communication: No difficulties  Cognition Arousal/Alertness: Awake/alert Behavior During Therapy: Flat affect Overall Cognitive Status: Within Functional Limits for tasks assessed                                 General Comments: very HOH right  now secondary to bilat ear infections per pt report        General Comments General comments (skin integrity, edema, etc.): smooth pursuits + end gaze nystagmus only. saccades vertical and horizontal WFL but slowed. Modified dix hallpike R + for room-spinning dizziness, modified dix hallpike L -    Exercises     Assessment/Plan    PT Assessment Patient needs continued PT services  PT Problem List Decreased strength;Decreased mobility;Decreased activity tolerance;Decreased balance;Decreased knowledge of use of DME;Pain;Decreased safety awareness       PT Treatment Interventions DME instruction;Therapeutic activities;Gait training;Patient/family education;Therapeutic exercise;Balance training;Stair training;Neuromuscular re-education;Functional mobility training    PT Goals (Current goals can be found in the Care Plan section)  Acute Rehab PT Goals Patient Stated Goal: home PT Goal Formulation: With patient Time For Goal Achievement: 03/27/22 Potential to Achieve Goals: Good    Frequency Min 3X/week     Co-evaluation               AM-PAC PT "6 Clicks" Mobility  Outcome Measure Help needed turning from your back to your side while in a flat bed without using bedrails?: A Little Help needed moving from lying on your back to sitting on the side of a flat bed without using bedrails?: A Lot Help needed moving to and from a bed to a chair (including a wheelchair)?: A Lot Help needed standing up from a chair using your arms (e.g., wheelchair or bedside chair)?: A Lot Help needed to walk in hospital room?: A Lot Help needed climbing 3-5 steps with a railing? : A Lot 6 Click Score: 13    End of Session   Activity Tolerance: Patient limited by fatigue Patient left: in bed;with call bell/phone within reach Nurse Communication: Mobility status PT Visit Diagnosis: Other abnormalities of gait and mobility (R26.89);Dizziness and giddiness (R42)    Time: 6283-1517 PT Time  Calculation (min) (ACUTE ONLY): 12 min   Charges:   PT Evaluation $PT Eval Low Complexity: 1 Low         Mayari Matus S, PT DPT Acute Rehabilitation Services Pager [phone]  Office [phone]   Truddie Coco 03/13/2022, 4:49 PM

## 2022-03-13 NOTE — Evaluation (Signed)
Occupational Therapy Evaluation Patient Details Name: Patricia Schmitt MRN: [id number] DOB: 18-Aug-1965 Today's Date: 03/13/2022   History of Present Illness 56 year old female admitted 03/10/22 with HTN , RSV+COVID, cough, myalgias and had a fall. Dx with sepsis, New onset diabetes mellitus presented with DKA which was severe, Metabolic acidosis from DKA/AKI. PMH includes: Hypertension, Meningitis spinal, Obesity, Abdominal hysterectomy (2007).   Clinical Impression   Pt is typically independent in ADL and mobility, drives, works full time in Radiation protection practitioner. Has a dog "sweet pea". Today she is limited by fatigue, dizziness, nausea/vomiting, decreased activity tolerance.  She is min A for bed mobility, while sitting EOB she becomes dizzy. No nystagmus noted - closes eyes though with positional changes. Bil eyes are bloodshot and there is evidence of discharge from her ears on the pillow. MMT for limbs are weak, but WFL. BP was 150/94 after sitting up for approx 2 min (was not initially connected to BP) Pt requesting to return supine due to nausea, but did take side steps up the bed at mod to min A (would take RW next time) and returned supine at min guard. Pt did proceed to vomit at that time (RN aware). Overall Pt is min A for UB ADL, mod A for LB ADL at this time. OT will continue to follow acutely with next session bringing EC handout and theraband exercises for maintenance of strength to work on activity tolerance. Plan for [facility] post-acute to maximize safety and independence in ADL and functional transfers.     Recommendations for follow up therapy are one component of a multi-disciplinary discharge planning process, led by the attending physician.  Recommendations may be updated based on patient status, additional functional criteria and insurance authorization.   Follow Up Recommendations  Home health OT    Assistance Recommended at Discharge Intermittent Supervision/Assistance   Patient can return home with the following A lot of help with walking and/or transfers;A lot of help with bathing/dressing/bathroom;Assistance with cooking/housework;Assist for transportation;Help with stairs or ramp for entrance    Functional Status Assessment  Patient has had a recent decline in their functional status and demonstrates the ability to make significant improvements in function in a reasonable and predictable amount of time.  Equipment Recommendations  BSC/3in1 (WIDE)    Recommendations for Other Services PT consult     Precautions / Restrictions Precautions Precautions: Fall Precaution Comments: COVID Restrictions Weight Bearing Restrictions: No      Mobility Bed Mobility Overal bed mobility: Needs Assistance Bed Mobility: Supine to Sit, Sit to Supine     Supine to sit: Min assist, HOB elevated Sit to supine: Min guard   General bed mobility comments: slight trunk elevation    Transfers Overall transfer level: Needs assistance Equipment used: 1 person hand held assist Transfers: Sit to/from Stand Sit to Stand: Mod assist           General transfer comment: mod to min A for balance during side steps up the bed      Balance Overall balance assessment: Needs assistance Sitting-balance support: Single extremity supported, Feet supported Sitting balance-Leahy Scale: Fair Sitting balance - Comments: EOB   Standing balance support: Single extremity supported Standing balance-Leahy Scale: Poor Standing balance comment: even with OT support, reaching out for environmental support - would benefit from RW - likely to need wide walker  ADL either performed or assessed with clinical judgement   ADL Overall ADL's : Needs assistance/impaired Eating/Feeding: Set up;Bed level (HOB elevated)   Grooming: Wash/dry hands;Wash/dry face;Oral care;Set up;Bed level Grooming Details (indicate cue type and reason): HOB  elevated Upper Body Bathing: Set up;Sitting   Lower Body Bathing: Moderate assistance;Sitting/lateral leans Lower Body Bathing Details (indicate cue type and reason): knees down Upper Body Dressing : Minimal assistance;Sitting   Lower Body Dressing: Moderate assistance;Sitting/lateral leans Lower Body Dressing Details (indicate cue type and reason): knees down Toilet Transfer: Moderate assistance;Stand-pivot Toilet Transfer Details (indicate cue type and reason): would have benefitted from a RW Toileting- Clothing Manipulation and Hygiene: Maximal assistance;Sit to/from stand       Functional mobility during ADLs: Moderate assistance (would benefit from RW (will need wide)) General ADL Comments: decreased activity tolerance, no SOB with activity, nausea/vomiting     Vision Baseline Vision/History: 1 Wears glasses (distance only) Ability to See in Adequate Light: 1 Impaired Patient Visual Report: Eye fatigue/eye pain/headache Additional Comments: eyes are very red/bloodshot     Perception     Praxis      Pertinent Vitals/Pain Pain Assessment Pain Assessment: No/denies pain     Hand Dominance Right   Extremity/Trunk Assessment Upper Extremity Assessment Upper Extremity Assessment: Generalized weakness (WFL during MMT, fatigues quickly)   Lower Extremity Assessment Lower Extremity Assessment: Defer to PT evaluation   Cervical / Trunk Assessment Cervical / Trunk Assessment: Other exceptions Cervical / Trunk Exceptions: obesity/body habitus   Communication Communication Communication: No difficulties   Cognition Arousal/Alertness: Awake/alert Behavior During Therapy: WFL for tasks assessed/performed Overall Cognitive Status: Within Functional Limits for tasks assessed                                 General Comments: emotional. "i'm so sorry, normally I am very independent, being this sick is depressing...will it ever get better?"     General  Comments  discharge on pillows from ears?!? purewick not working - Merchandiser, retail      Home Living Family/patient expects to be discharged to:: Private residence Living Arrangements: Spouse/significant other (dog) Available Help at Discharge: Family;Available PRN/intermittently Type of Home: House Home Access: Stairs to enter [employer] of Steps: 1 Entrance Stairs-Rails: None Home Layout: Multi-level;Able to live on main level with bedroom/bathroom     Bathroom Shower/Tub: Occupational psychologist: Handicapped height     Home Equipment: Shower seat - built in;Hand held shower head   Additional Comments: pharm. raw materials rep      Prior Functioning/Environment Prior Level of Function : Independent/Modified Independent                        OT Problem List: Decreased strength;Decreased activity tolerance;Impaired balance (sitting and/or standing);Decreased safety awareness;Decreased knowledge of precautions;Decreased knowledge of use of DME or AE;Obesity      OT Treatment/Interventions: Self-care/ADL training;Therapeutic exercise;Energy conservation;DME and/or AE instruction;Therapeutic activities;Patient/family education;Balance training    OT Goals(Current goals can be found in the care plan section) Acute Rehab OT Goals Patient Stated Goal: feel better; get ears cleaned out OT Goal Formulation: With patient Time For Goal Achievement: 03/27/22 Potential to Achieve Goals: Good ADL Goals Pt Will Perform Grooming: with supervision;standing Pt Will Perform Upper Body Dressing: with set-up;sitting Pt Will Perform Lower Body Dressing: with set-up;sit to/from stand  Pt Will Transfer to Toilet: with supervision;ambulating Pt Will Perform Toileting - Clothing Manipulation and hygiene: with set-up;sit to/from stand Additional ADL Goal #1: Pt will verbalize 3 ways of conserving energy during ADL routine at  independent level  OT Frequency: Min 2X/week    Co-evaluation              AM-PAC OT "6 Clicks" Daily Activity     Outcome Measure Help from another person eating meals?: A Little Help from another person taking care of personal grooming?: A Little Help from another person toileting, which includes using toliet, bedpan, or urinal?: A Lot Help from another person bathing (including washing, rinsing, drying)?: A Lot Help from another person to put on and taking off regular upper body clothing?: A Little Help from another person to put on and taking off regular lower body clothing?: A Lot 6 Click Score: 15   End of Session Equipment Utilized During Treatment: Gait belt Nurse Communication: Mobility status (check purewick, vomiting)  Activity Tolerance: Patient limited by fatigue Patient left: in bed;with call bell/phone within reach;with bed alarm set  OT Visit Diagnosis: Unsteadiness on feet (R26.81);Other abnormalities of gait and mobility (R26.89);Muscle weakness (generalized) (M62.81);History of falling (Z91.81);Dizziness and giddiness (R42)                Time: 0623-7628 OT Time Calculation (min): 42 min Charges:  OT General Charges $OT Visit: 1 Visit OT Evaluation $OT Eval Moderate Complexity: 1 Mod OT Treatments $Self Care/Home Management : 23-37 mins  Nyoka Cowden OTR/L Acute Rehabilitation Services Office: [phone]   Evern Bio [facility] 03/13/2022, 10:50 AM

## 2022-03-14 DIAGNOSIS — U071 COVID-19: Secondary | ICD-10-CM

## 2022-03-14 DIAGNOSIS — N179 Acute kidney failure, unspecified: Secondary | ICD-10-CM

## 2022-03-14 DIAGNOSIS — I10 Essential (primary) hypertension: Secondary | ICD-10-CM

## 2022-03-14 DIAGNOSIS — E876 Hypokalemia: Secondary | ICD-10-CM

## 2022-03-14 LAB — GLUCOSE, CAPILLARY
Glucose-Capillary: 211 mg/dL — ABNORMAL HIGH (ref 70–99)
Glucose-Capillary: 246 mg/dL — ABNORMAL HIGH (ref 70–99)
Glucose-Capillary: 227 mg/dL — ABNORMAL HIGH (ref 70–99)
Glucose-Capillary: 182 mg/dL — ABNORMAL HIGH (ref 70–99)

## 2022-03-14 LAB — POTASSIUM: Potassium: 3.8 mmol/L (ref 3.5–5.1)

## 2022-03-14 MED ORDER — AMLODIPINE BESYLATE 10 MG PO TABS
10.0000 mg | ORAL_TABLET | Freq: Every day | ORAL | Status: DC
  Administered 2022-03-15 – 2022-03-16 (×2): 10 mg via ORAL
  Filled 2022-03-14 (×2): qty 1

## 2022-03-14 MED ORDER — INSULIN DETEMIR 100 UNIT/ML ~~LOC~~ SOLN
30.0000 [IU] | Freq: Every day | SUBCUTANEOUS | Status: DC
  Filled 2022-03-14 (×3): qty 0.3

## 2022-03-14 MED ORDER — PROCHLORPERAZINE EDISYLATE 10 MG/2ML IJ SOLN
10.0000 mg | Freq: Four times a day (QID) | INTRAMUSCULAR | Status: DC | PRN
  Administered 2022-03-14: 10 mg via INTRAVENOUS
  Filled 2022-03-14 (×2): qty 2

## 2022-03-14 NOTE — Progress Notes (Signed)
Physical Therapy Treatment Patient Details Name: Patricia Schmitt MRN: [id number] DOB: 05-25-1966 Today's Date: 03/14/2022   History of Present Illness 56 year old female admitted 03/10/22 with HTN , RSV+COVID, cough, myalgias and had a fall. Dx with sepsis, New onset diabetes mellitus presented with DKA which was severe, Metabolic acidosis from DKA/AKI. PMH includes: Hypertension, Meningitis spinal, Obesity, Abdominal hysterectomy (2007).    PT Comments    Pt received in supine, agreeable to therapy session with significant encouragement, pt anxious due to limited mobility, hard of hearing and reports she had no assist for bathing or personal hygiene in AM despite purewick apparent malfunction in morning and overnight. Pt performed transfer training and gait with up to minA for initial transfer, and mostly min guard for short household distance gait trial in room with RW support. RN notified pt states she has not had a BM in ~9 days before this and only had a small one when she used bathroom during session. Pt agreeable to sit up in chair while NT entering room to change her sheets at end of session and tearful due to fatigue/malaise. Pt continues to benefit from PT services to progress toward functional mobility goals.   Recommendations for follow up therapy are one component of a multi-disciplinary discharge planning process, led by the attending physician.  Recommendations may be updated based on patient status, additional functional criteria and insurance authorization.  Follow Up Recommendations  Home health PT     Assistance Recommended at Discharge Frequent or constant Supervision/Assistance  Patient can return home with the following A lot of help with bathing/dressing/bathroom;A little help with walking and/or transfers;Help with stairs or ramp for entrance   Equipment Recommendations  None recommended by PT    Recommendations for Other Services       Precautions / Restrictions  Precautions Precautions: Fall Precaution Comments: COVID Restrictions Weight Bearing Restrictions: No     Mobility  Bed Mobility Overal bed mobility: Needs Assistance Bed Mobility: Supine to Sit     Supine to sit: Min guard, HOB elevated     General bed mobility comments: use of bed features/rails    Transfers Overall transfer level: Needs assistance Equipment used: Rolling walker (2 wheels) Transfers: Sit to/from Stand, Bed to chair/wheelchair/BSC Sit to Stand: Min assist   Step pivot transfers: Min guard       General transfer comment: EOB> chair, then pt agreeable to continue gait trial after seated rest break.    Ambulation/Gait Ambulation/Gait assistance: Min guard Gait Distance (Feet): 25 Feet (x2 to/from toilet from far side of room.) Assistive device: Rolling walker (2 wheels) Gait Pattern/deviations: Step-through pattern, Trunk flexed, Decreased dorsiflexion - right, Decreased dorsiflexion - left       General Gait Details: fair cadence, pt mildly impulsive to pick up RW when obstacles in her way rather than waiting for assist from therapist, pt HoH so poor understanding of safety cues with regard to RW management. mild DOE 1/4, pt requesting time for toileting and c/o nausea/fatigue after toileting so defers longer gait trial in the room.       Balance Overall balance assessment: Needs assistance Sitting-balance support: Feet supported, No upper extremity supported Sitting balance-Leahy Scale: Fair     Standing balance support: Bilateral upper extremity supported, During functional activity Standing balance-Leahy Scale: Fair Standing balance comment: fair using RW, min guard for safety, pt lifting up RW on 2 occasions in narrow spaces and no LOB when attempting this; cues for safer technique  Cognition Arousal/Alertness: Awake/alert Behavior During Therapy: Flat affect Overall Cognitive Status: Within Functional  Limits for tasks assessed                                 General Comments: Pt is very HOH right now secondary to bilat ear infections per pt report, she seems to hear better in her L ear. Pt often mishearing things therapist says, difficult due to N95 masks/face shields. Pt tearful and reports she feels neglected today, RN notified to check in with her more often about her needs.        Exercises Other Exercises Other Exercises: pt defers due to fatigue after gait/transfer training    General Comments General comments (skin integrity, edema, etc.): pt c/o mild nausea and brought blue bag with her for mobility in room but did not have episode of n/v. pt requesting a bath (not at time of session when asked, pt defers) and reports no BM for >9 days. Pt states normally she goes every day. She had a small BM in bathroom but "not much" per her report.      Pertinent Vitals/Pain Pain Assessment Pain Assessment: Faces Faces Pain Scale: Hurts little more Pain Location: generalized Pain Descriptors / Indicators: Discomfort, Grimacing Pain Intervention(s): Monitored during session, Limited activity within patient's tolerance, Repositioned           PT Goals (current goals can now be found in the care plan section) Acute Rehab PT Goals Patient Stated Goal: to feel better and go home PT Goal Formulation: With patient Time For Goal Achievement: 03/27/22 Progress towards PT goals: Progressing toward goals    Frequency    Min 3X/week      PT Plan Current plan remains appropriate       AM-PAC PT "6 Clicks" Mobility   Outcome Measure  Help needed turning from your back to your side while in a flat bed without using bedrails?: None Help needed moving from lying on your back to sitting on the side of a flat bed without using bedrails?: A Little Help needed moving to and from a bed to a chair (including a wheelchair)?: A Little Help needed standing up from a chair using  your arms (e.g., wheelchair or bedside chair)?: A Little Help needed to walk in hospital room?: A Little Help needed climbing 3-5 steps with a railing? : A Lot 6 Click Score: 18    End of Session Equipment Utilized During Treatment: Gait belt Activity Tolerance: Patient tolerated treatment well;Patient limited by fatigue Patient left: in chair;with call bell/phone within reach;Other (comment) (lab tech and NT entering the room) Nurse Communication: Mobility status;Other (comment) (pt upset about no one offering her a bath or changing her sheets yet today.) PT Visit Diagnosis: Other abnormalities of gait and mobility (R26.89);Dizziness and giddiness (R42)     Time: 3536-1443 PT Time Calculation (min) (ACUTE ONLY): 35 min  Charges:  $Gait Training: 8-22 mins $Therapeutic Activity: 8-22 mins                     Ranulfo Kall P., PTA Acute Rehabilitation Services Secure Chat Preferred 9a-5:30pm Office: [place] 03/14/2022, 3:28 PM

## 2022-03-14 NOTE — Progress Notes (Signed)
PROGRESS NOTE    Patricia Schmitt  EPP:[id number] DOB: 05-26-1966 DOA: 03/10/2022 PCP: Pcp, No    Brief Narrative:  Patricia Schmitt is a 56 y/o woman with a history of HTN, DM who presented with 5 days of nausea, vomiting, weakness, dyspnea, cough, congestion, fatigue, myalgias, decrease appetite. She recently traveled to [place] and [place] and her travel companion has RSV. She fell on the floor today and was unable to get up. EMS was called to bring her to the hospital. In the ED she was hypothermic, tachycardic, hypertensive and was found to have severe metabolic acidosis and hyperglycemia. Found to be in DKA. She was given 3.5L fluids per sepsis protocol as well as ceftriaxone and azithromycin. Started on insulin gtt. Blood cultures, UC, covid/flu, RVP obtained in the ED.  She was COVID-positive.  She was admitted initially to ICU. Being treated for COVID without pneumonia and possible otitis 10/14: admitted to [facility] for DKA  TRH pickup on 10/16   Consultants:  PCCM  Procedures:   Antimicrobials:      Subjective: Feels like she is hungry and wants to eat. Rt ear hard to hear so I have to speak up. Feeling little better today.  Objective: Vitals:   03/13/22 2044 03/14/22 0449 03/14/22 0500 03/14/22 0824  BP: (!) 156/96 (!) 125/107  (!) 153/89  Pulse: 98 88  97  Resp: '16 18  16  ' Temp: 97.7 F (36.5 C) 98.2 F (36.8 C)  98 F (36.7 C)  TempSrc: Oral Oral  Oral  SpO2: 99% 95%  99%  Weight:   113.8 kg   Height:        Intake/Output Summary (Last 24 hours) at 03/14/2022 1305 Last data filed at 03/14/2022 0435 Gross per 24 hour  Intake --  Output 2150 ml  Net -2150 ml   Filed Weights   03/12/22 0600 03/13/22 0323 03/14/22 0500  Weight: 113.8 kg 113.5 kg 113.8 kg    Examination: Calm, NAD Dull to percussion Reg s1/s2 no gallop Soft benign +bs No edema Awake and alert Mood and affect appropriate in current setting     Data Reviewed: I have personally  reviewed following labs and imaging studies  CBC: Recent Labs  Lab 03/10/22 1712 03/10/22 1823 03/10/22 2040 03/11/22 0722 03/12/22 0552 03/13/22 0332  WBC 18.2*  --  18.9* 18.0* 13.7* 10.5  NEUTROABS 15.3*  --   --   --  10.8* 7.1  HGB 18.2* 19.7* 16.7* 15.4* 14.5 14.1  HCT 55.1* 58.0* 50.8* 42.8 39.0 38.7  MCV 91.7  --  89.1 82.9 81.3 82.0  PLT 309  --  277 201 203 606   Basic Metabolic Panel: Recent Labs  Lab 03/10/22 2040 03/11/22 0003 03/11/22 0722 03/11/22 1204 03/12/22 0552 03/12/22 0553 03/13/22 0332  NA 131* 134* 133* 134* 136  --  138  K 3.5 3.6 3.0* 3.0* 3.4*  --  3.3*  CL 103 106 108 106 106  --  106  CO2 <7* 9* 14* 19* 18*  --  23  GLUCOSE 528* 318* 204* 193* 265*  --  233*  BUN 26* 23* '20 17 13  ' --  10  CREATININE 1.34* 1.23* 0.91 0.71 0.76  --  0.50  CALCIUM 9.0 9.3 9.1 9.0 8.6*  --  8.6*  MG 1.9  --   --  1.6*  --  2.3  --   PHOS 3.0  --   --   --   --   --   --  GFR: Estimated Creatinine Clearance: 95.9 mL/min (by C-G formula based on SCr of 0.5 mg/dL). Liver Function Tests: Recent Labs  Lab 03/10/22 1712 03/11/22 0722  AST 26 15  ALT 20 17  ALKPHOS 150* 90  BILITOT 2.0* 0.5  PROT 8.0 6.2*  ALBUMIN 3.6 2.6*   Recent Labs  Lab 03/10/22 2040 03/12/22 0553  LIPASE 62* 38   Recent Labs  Lab 03/10/22 2040  AMMONIA 26   Coagulation Profile: Recent Labs  Lab 03/10/22 1712  INR 1.3*   Cardiac Enzymes: No results for input(s): "CKTOTAL", "CKMB", "CKMBINDEX", "TROPONINI" in the last 168 hours. BNP (last 3 results) No results for input(s): "PROBNP" in the last 8760 hours. HbA1C: No results for input(s): "HGBA1C" in the last 72 hours. CBG: Recent Labs  Lab 03/13/22 0834 03/13/22 1204 03/13/22 1543 03/14/22 0827 03/14/22 1201  GLUCAP 248* 251* 207* 211* 246*   Lipid Profile: No results for input(s): "CHOL", "HDL", "LDLCALC", "TRIG", "CHOLHDL", "LDLDIRECT" in the last 72 hours. Thyroid Function Tests: No results for  input(s): "TSH", "T4TOTAL", "FREET4", "T3FREE", "THYROIDAB" in the last 72 hours. Anemia Panel: No results for input(s): "VITAMINB12", "FOLATE", "FERRITIN", "TIBC", "IRON", "RETICCTPCT" in the last 72 hours. Sepsis Labs: Recent Labs  Lab 03/10/22 1711 03/10/22 2040 03/11/22 0003 03/11/22 0722 03/12/22 0553  PROCALCITON  --  0.48  --  0.94 0.62  LATICACIDVEN 3.7* 2.2* 2.3*  --   --     Recent Results (from the past 240 hour(s))  Resp Panel by RT-PCR (Flu A&B, Covid) Anterior Nasal Swab     Status: Abnormal   Collection Time: 03/10/22  5:12 PM   Specimen: Anterior Nasal Swab  Result Value Ref Range Status   SARS Coronavirus 2 by RT PCR POSITIVE (A) NEGATIVE Final    Comment: (NOTE) SARS-CoV-2 target nucleic acids are DETECTED.  The SARS-CoV-2 RNA is generally detectable in upper respiratory specimens during the acute phase of infection. Positive results are indicative of the presence of the identified virus, but do not rule out bacterial infection or co-infection with other pathogens not detected by the test. Clinical correlation with patient history and other diagnostic information is necessary to determine patient infection status. The expected result is Negative.  Fact Sheet for Patients: EntrepreneurPulse.com.au  Fact Sheet for Healthcare Providers: IncredibleEmployment.be  This test is not yet approved or cleared by the [place] FDA and  has been authorized for detection and/or diagnosis of SARS-CoV-2 by FDA under an Emergency Use Authorization (EUA).  This EUA will remain in effect (meaning this test can be used) for the duration of  the COVID-19 declaration under Section 564(b)(1) of the A ct, 21 U.S.C. section 360bbb-3(b)(1), unless the authorization is terminated or revoked sooner.     Influenza A by PCR NEGATIVE NEGATIVE Final   Influenza B by PCR NEGATIVE NEGATIVE Final    Comment: (NOTE) The Xpert Xpress  SARS-CoV-2/FLU/RSV plus assay is intended as an aid in the diagnosis of influenza from Nasopharyngeal swab specimens and should not be used as a sole basis for treatment. Nasal washings and aspirates are unacceptable for Xpert Xpress SARS-CoV-2/FLU/RSV testing.  Fact Sheet for Patients: EntrepreneurPulse.com.au  Fact Sheet for Healthcare Providers: IncredibleEmployment.be  This test is not yet approved or cleared by the [place] FDA and has been authorized for detection and/or diagnosis of SARS-CoV-2 by FDA under an Emergency Use Authorization (EUA). This EUA will remain in effect (meaning this test can be used) for the duration of the COVID-19 declaration under Section  564(b)(1) of the Act, 21 U.S.C. section 360bbb-3(b)(1), unless the authorization is terminated or revoked.  Performed at [facility], [place] [address] [place], [place] [address]   Blood Culture (routine x 2)     Status: None (Preliminary result)   Collection Time: 03/10/22  5:12 PM   Specimen: BLOOD  Result Value Ref Range Status   Specimen Description BLOOD RIGHT ANTECUBITAL  Final   Special Requests   Final    BOTTLES DRAWN AEROBIC AND ANAEROBIC Blood Culture adequate volume   Culture   Final    NO GROWTH 4 DAYS Performed at [facility], [place] [address], [place], [place] [address]    Report Status PENDING  Incomplete  Respiratory (~20 pathogens) panel by PCR     Status: None   Collection Time: 03/10/22  5:12 PM   Specimen: Nasopharyngeal Swab; Respiratory  Result Value Ref Range Status   Adenovirus NOT DETECTED NOT DETECTED Final   Coronavirus 229E NOT DETECTED NOT DETECTED Final    Comment: (NOTE) The Coronavirus on the Respiratory Panel, DOES NOT test for the novel  Coronavirus (2019 nCoV)    Coronavirus HKU1 NOT DETECTED NOT DETECTED Final   Coronavirus NL63 NOT DETECTED NOT DETECTED Final   Coronavirus OC43 NOT DETECTED NOT DETECTED Final    Metapneumovirus NOT DETECTED NOT DETECTED Final   Rhinovirus / Enterovirus NOT DETECTED NOT DETECTED Final   Influenza A NOT DETECTED NOT DETECTED Final   Influenza B NOT DETECTED NOT DETECTED Final   Parainfluenza Virus 1 NOT DETECTED NOT DETECTED Final   Parainfluenza Virus 2 NOT DETECTED NOT DETECTED Final   Parainfluenza Virus 3 NOT DETECTED NOT DETECTED Final   Parainfluenza Virus 4 NOT DETECTED NOT DETECTED Final   Respiratory Syncytial Virus NOT DETECTED NOT DETECTED Final   Bordetella pertussis NOT DETECTED NOT DETECTED Final   Bordetella Parapertussis NOT DETECTED NOT DETECTED Final   Chlamydophila pneumoniae NOT DETECTED NOT DETECTED Final   Mycoplasma pneumoniae NOT DETECTED NOT DETECTED Final    Comment: Performed at [facility], [place]. [address], [place], [place] [address]  Urine Culture     Status: Abnormal   Collection Time: 03/10/22  5:57 PM   Specimen: In/Out Cath Urine  Result Value Ref Range Status   Specimen Description IN/OUT CATH URINE  Final   Special Requests   Final    NONE Performed at [facility], [place] [address] [place], [place] [address]    Culture MULTIPLE SPECIES PRESENT, SUGGEST RECOLLECTION (A)  Final   Report Status 03/12/2022 FINAL  Final  Blood Culture (routine x 2)     Status: None (Preliminary result)   Collection Time: 03/10/22  8:40 PM   Specimen: BLOOD  Result Value Ref Range Status   Specimen Description BLOOD RIGHT ANTECUBITAL  Final   Special Requests   Final    BOTTLES DRAWN AEROBIC AND ANAEROBIC Blood Culture results may not be optimal due to an inadequate volume of blood received in culture bottles   Culture   Final    NO GROWTH 4 DAYS Performed at [facility], [place] [address], [place], [place] [address]    Report Status PENDING  Incomplete  MRSA Next Gen by PCR, Nasal     Status: None   Collection Time: 03/10/22 10:40 PM   Specimen: Nasal Mucosa; Nasal Swab  Result Value Ref Range Status   MRSA by PCR Next Gen  NOT DETECTED NOT DETECTED Final  Comment: (NOTE) The GeneXpert MRSA Assay (FDA approved for NASAL specimens only), is one component of a comprehensive MRSA colonization surveillance program. It is not intended to diagnose MRSA infection nor to guide or monitor treatment for MRSA infections. Test performance is not FDA approved in patients less than 47 years old. Performed at [facility], [place] [address] [place], [place] [address]          Radiology Studies: ECHOCARDIOGRAM COMPLETE  Result Date: 03/12/2022    ECHOCARDIOGRAM REPORT   Patient Name:   Patricia Schmitt Date of Exam: 03/12/2022 Medical Rec #:  [id number]        Height:       64.0 in Accession #:    [id number]       Weight:       250.9 lb Date of Birth:  Oct 26, 1965        BSA:          2.154 m Patient Age:    87 years         BP:           159/100 mmHg Patient Gender: F                HR:           106 bpm. Exam Location:  Inpatient Procedure: 2D Echo, Cardiac Doppler and Color Doppler Indications:    Cardiomegaly I51.7  History:        Patient has no prior history of Echocardiogram examinations.                 COVID, Signs/Symptoms:Dyspnea; Risk Factors:Diabetes, Non-Smoker                 and Hypertension.  Sonographer:    Greer Pickerel Referring Phys: [id number] Candee Furbish  Sonographer Comments: Technically difficult study due to poor echo windows, Technically challenging study due to limited acoustic windows, patient is obese, suboptimal subcostal window, suboptimal parasternal window and suboptimal apical window. Image acquisition challenging due to respiratory motion. Patient refused definity. IMPRESSIONS  1. Left ventricular ejection fraction, by estimation, is 60 to 65%. The left ventricle has normal function. The left ventricle has no regional wall motion abnormalities. There is mild concentric left ventricular hypertrophy. Left ventricular diastolic parameters are consistent with Grade I diastolic dysfunction  (impaired relaxation).  2. Right ventricular systolic function is normal. The right ventricular size is normal. Tricuspid regurgitation signal is inadequate for assessing PA pressure.  3. The mitral valve is normal in structure. Trivial mitral valve regurgitation.  4. The aortic valve is tricuspid. Aortic valve regurgitation is not visualized. No aortic stenosis is present.  5. The inferior vena cava is normal in size with greater than 50% respiratory variability, suggesting right atrial pressure of 3 mmHg. Comparison(s): No prior Echocardiogram. FINDINGS  Left Ventricle: Left ventricular ejection fraction, by estimation, is 60 to 65%. The left ventricle has normal function. The left ventricle has no regional wall motion abnormalities. The left ventricular internal cavity size was normal in size. There is  mild concentric left ventricular hypertrophy. Left ventricular diastolic parameters are consistent with Grade I diastolic dysfunction (impaired relaxation). Right Ventricle: The right ventricular size is normal. No increase in right ventricular wall thickness. Right ventricular systolic function is normal. Tricuspid regurgitation signal is inadequate for assessing PA pressure. Left Atrium: Left atrial size was normal in size. Right Atrium: Right atrial size was normal in size. Pericardium: There is no evidence of pericardial effusion.  Mitral Valve: The mitral valve is normal in structure. Trivial mitral valve regurgitation. Tricuspid Valve: The tricuspid valve is normal in structure. Tricuspid valve regurgitation is trivial. Aortic Valve: The aortic valve is tricuspid. Aortic valve regurgitation is not visualized. No aortic stenosis is present. Pulmonic Valve: The pulmonic valve was normal in structure. Pulmonic valve regurgitation is trivial. Aorta: The aortic root and ascending aorta are structurally normal, with no evidence of dilitation. Venous: The inferior vena cava is normal in size with greater than 50%  respiratory variability, suggesting right atrial pressure of 3 mmHg. IAS/Shunts: The atrial septum is grossly normal.  LEFT VENTRICLE PLAX 2D LVIDd:         4.10 cm   Diastology LVIDs:         2.70 cm   LV e' medial:    6.96 cm/s LV PW:         0.90 cm   LV E/e' medial:  10.5 LV IVS:        0.90 cm   LV e' lateral:   5.87 cm/s LVOT diam:     1.70 cm   LV E/e' lateral: 12.4 LV SV:         51 LV SV Index:   24 LVOT Area:     2.27 cm  LEFT ATRIUM           Index        RIGHT ATRIUM           Index LA Vol (A2C): 30.1 ml 13.97 ml/m  RA Area:     13.60 cm LA Vol (A4C): 56.5 ml 26.23 ml/m  RA Volume:   29.90 ml  13.88 ml/m  AORTIC VALVE LVOT Vmax:   152.00 cm/s LVOT Vmean:  99.400 cm/s LVOT VTI:    0.225 m  AORTA Ao Root diam: 3.10 cm Ao Asc diam:  2.95 cm MITRAL VALVE MV Area (PHT): 4.96 cm    SHUNTS MV Decel Time: 153 msec    Systemic VTI:  0.22 m MV E velocity: 72.80 cm/s  Systemic Diam: 1.70 cm MV A velocity: 99.40 cm/s MV E/A ratio:  0.73 Gwyndolyn Kaufman MD Electronically signed by Gwyndolyn Kaufman MD Signature Date/Time: 03/12/2022/7:08:33 PM    Final         Scheduled Meds:  amLODipine  5 mg Oral Daily   artificial tears   Both Eyes Q8H   cefdinir  300 mg Oral Q12H   Chlorhexidine Gluconate Cloth  6 each Topical Daily   heparin  5,000 Units Subcutaneous Q8H   insulin aspart  0-15 Units Subcutaneous TID WC   insulin aspart  0-5 Units Subcutaneous QHS   insulin detemir  25 Units Subcutaneous Daily   insulin starter kit- pen needles  1 kit Other Once   lisinopril  5 mg Oral Daily   living well with diabetes book   Does not apply Once   metFORMIN  500 mg Oral BID WC   ofloxacin  1 drop Both Eyes QID   ofloxacin  5 drop Left EAR Daily   potassium chloride  40 mEq Oral BID   Continuous Infusions:  Assessment & Plan:   Principal Problem:   DKA (diabetic ketoacidosis) ([place]) Active Problems:   COVID-19 virus infection   AKI (acute kidney injury) ([place])   Hypokalemia   Hypomagnesemia    Accelerated hypertension   Sepsis on admission likely secondary to severe bilateral conjunctivitis and ear infection versus RSV + COVID No oxygen requirement currently We will  start Omnicef twice daily for ear infection continue ofloxacin otic drops and add      Asymptomatic COVID Has cough only inflammatory markers not checked on admission but as asymptomatic do not expect other issues 10/18 appears received paxlovid and dc/'d On RA>     New onset diabetes mellitus presented with DKA which was severe She will need insulin teaching--diabetic coordinator RN to follow-up 10/18 BG unstable Diabetic educator following recommends increase Lantus to 30 units Continue other regimen    Uncontrolled hypertension Is room for improvement increase amlodipine to 10 mg daily Continue lisinopril   AKI on admission Metabolic acidosis from DKA/AKI Resolving gradually-force oral fluids DC saline recheck labs a.m.   Hypokalemia hypomagnesemia earlier in hospital stay Now resolved with replacement   Morbid obesity BMI 43     DVT prophylaxis: Heparin subcu Code Status: Full Family Communication: None at bedside Disposition Plan:  Status is: Inpatient Remains inpatient appropriate because: IV treatment        LOS: 4 days   Time spent: 35 minutes    Nolberto Hanlon, MD [facility] Pager [phone]  If 7PM-7AM, please contact night-coverage 03/14/2022, 1:05 PM

## 2022-03-14 NOTE — Inpatient Diabetes Management (Signed)
Inpatient Diabetes Program Recommendations  AACE/ADA: New Consensus Statement on Inpatient Glycemic Control (2015)  Target Ranges:  Prepandial:   less than 140 mg/dL      Peak postprandial:   less than 180 mg/dL (1-2 hours)      Critically ill patients:  140 - 180 mg/dL   Lab Results  Component Value Date   GLUCAP 211 (H) 03/14/2022   HGBA1C 14.0 (H) 03/10/2022    Review of Glycemic Control  Latest Reference Range & Units 03/13/22 08:34 03/13/22 12:04 03/13/22 15:43 03/14/22 08:27  Glucose-Capillary 70 - 99 mg/dL 248 (H) 251 (H) 207 (H) 211 (H)  (H): Data is abnormally high  Diabetes history: DM2 Outpatient Diabetes medications: None Current orders for Inpatient glycemic control: Levemir 25 units QD, Novolog 0-15 units TID and 0-5 units QHS, Metformin 500 mg BID  Inpatient Diabetes Program Recommendations:    Levemir 30 units QD   Will continue to follow while inpatient.  Thank you, Reche Dixon, MSN, [place] Diabetes Coordinator Inpatient Diabetes Program [phone] (team pager from 8a-5p)

## 2022-03-15 LAB — CULTURE, BLOOD (ROUTINE X 2)
Culture: NO GROWTH
Culture: NO GROWTH
Special Requests: ADEQUATE

## 2022-03-15 LAB — GLUCOSE, CAPILLARY
Glucose-Capillary: 186 mg/dL — ABNORMAL HIGH (ref 70–99)
Glucose-Capillary: 244 mg/dL — ABNORMAL HIGH (ref 70–99)
Glucose-Capillary: 268 mg/dL — ABNORMAL HIGH (ref 70–99)
Glucose-Capillary: 242 mg/dL — ABNORMAL HIGH (ref 70–99)

## 2022-03-15 NOTE — Progress Notes (Signed)
OT Cancellation Note  Patient Details Name: Patricia Schmitt MRN: [id number] DOB: Aug 14, 1965   Cancelled Treatment:    Reason Eval/Treat Not Completed: Patient declined, no reason specified (Patient declined OT at this time stating she wanted to rest longer. Will attempt again later today as schedule permits.) Lodema Hong, [place]  Office [place] 03/15/2022, 8:17 AM

## 2022-03-15 NOTE — Progress Notes (Signed)
Physical Therapy Treatment Patient Details Name: Patricia Schmitt MRN: [id number] DOB: 12-Apr-1966 Today's Date: 03/15/2022   History of Present Illness 56 year old female admitted 03/10/22 with HTN , RSV+COVID, cough, myalgias and had a fall. Dx with sepsis, New onset diabetes mellitus presented with DKA which was severe, Metabolic acidosis from DKA/AKI. PMH includes: Hypertension, Meningitis spinal, Obesity, Abdominal hysterectomy (2007).    PT Comments    Pt received in supine, agreeable to therapy session with encouragement, pt c/o meals not being what she ordered and resultant decreased PO intake. Pt encouraged to call in her own orders if possible and to send back and re-order if food is not what she wanted, RN notified. Pt with some confusion about events of previous date, unsure if memory deficit or other reason. Pt able to perform bed mobility and transfers with supervision to min guard for safety and increased time and gait with RW short distance in room with Supervision. Pt c/o fatigue once returned to recliner and reluctant to sit up but encouraged her to daily at mealtimes for 1-2 hours at least to build strength/endurance and for pulmonary clearance. RN notified of pt concerns, anticipate pt able to pivot to [facility] safely with RW unless dizziness returns (pt has not c/o dizziness for past 2 sessions) so BSC placed near bedside with RW and pt instructed to use call bell for any OOB mobility so staff can supervise/assist PRN. Pt would benefit from working with mobility specialist daily as well. Pt continues to benefit from PT services to progress toward functional mobility goals.   Recommendations for follow up therapy are one component of a multi-disciplinary discharge planning process, led by the attending physician.  Recommendations may be updated based on patient status, additional functional criteria and insurance authorization.  Follow Up Recommendations  Home health PT     Assistance  Recommended at Discharge Frequent or constant Supervision/Assistance  Patient can return home with the following A lot of help with bathing/dressing/bathroom;A little help with walking and/or transfers;Help with stairs or ramp for entrance   Equipment Recommendations  Rolling walker (2 wheels) (bariatric size, pending progress)    Recommendations for Other Services       Precautions / Restrictions Precautions Precautions: Fall Precaution Comments: COVID Restrictions Weight Bearing Restrictions: No     Mobility  Bed Mobility Overal bed mobility: Needs Assistance Bed Mobility: Supine to Sit     Supine to sit: HOB elevated, Supervision     General bed mobility comments: use of bed features/rails, increased time to initiate and perform    Transfers Overall transfer level: Needs assistance Equipment used: Rolling walker (2 wheels) Transfers: Sit to/from Stand, Bed to chair/wheelchair/BSC Sit to Stand: Min guard           General transfer comment: from EOB to RW and low toilet seat height to RW using wall rail in bathroom    Ambulation/Gait Ambulation/Gait assistance: Supervision Gait Distance (Feet): 25 Feet (x2 to/from toilet) Assistive device: Rolling walker (2 wheels) Gait Pattern/deviations: Step-through pattern, Trunk flexed, Decreased dorsiflexion - right, Decreased dorsiflexion - left       General Gait Details: fair cadence, pt self-limiting distance due to fatigue, to/from bathroom and recliner chair by windows.   Stairs Stairs:  (pt defers due to fatigue and reports poor PO intake due to meals "not what I ordered")           Wheelchair Mobility    Modified Rankin (Stroke Patients Only)       Balance  Overall balance assessment: Needs assistance Sitting-balance support: Feet supported, No upper extremity supported Sitting balance-Leahy Scale: Good     Standing balance support: Bilateral upper extremity supported, During functional  activity Standing balance-Leahy Scale: Fair Standing balance comment: good balance using RW, pt fatigued so did not assess standing balance without RW, pt able to perform her own peri-care squatting with U UE support                            Cognition Arousal/Alertness: Awake/alert Behavior During Therapy: Flat affect Overall Cognitive Status: Within Functional Limits for tasks assessed                                 General Comments: Pt is very HOH right now secondary to bilat ear infections per pt report, she seems to hear better in her L ear. Pt often mishearing things therapist says, difficult due to N95 masks/face shields. Pt c/o staff not making her bed and forcing her to sit in the chair "for hours" yesterday but this was untrue as PTA saw NT making her bed just after PT session when she got up to chair. Pt encouraged to sit up in chair at least 1-2 hours around mealtimes to reduce risk of pneumonia and for strengthening/improved pulmonary clearance. Pt c/o poor PO intake due to meals being "not what I ordered" and "not safe for diabetic dietary needs", encouraged her to speak with RN/dietary staff regarding her concerns and to be very specific with what she orders.        Exercises Other Exercises Other Exercises: pt defers due to fatigue after gait/transfer training    General Comments General comments (skin integrity, edema, etc.): VSS per chart review, no acute s/sx distress other than fatigue      Pertinent Vitals/Pain Pain Assessment Pain Assessment: Faces Faces Pain Scale: Hurts a little bit Pain Location: generalized Pain Descriptors / Indicators: Discomfort Pain Intervention(s): Monitored during session, Repositioned (no request for pain meds or specific complaint)     PT Goals (current goals can now be found in the care plan section) Acute Rehab PT Goals Patient Stated Goal: to feel better and go home PT Goal Formulation: With  patient Time For Goal Achievement: 03/27/22 Progress towards PT goals: Progressing toward goals (slow progress)    Frequency    Min 3X/week      PT Plan Current plan remains appropriate       AM-PAC PT "6 Clicks" Mobility   Outcome Measure  Help needed turning from your back to your side while in a flat bed without using bedrails?: None Help needed moving from lying on your back to sitting on the side of a flat bed without using bedrails?: A Little Help needed moving to and from a bed to a chair (including a wheelchair)?: A Little Help needed standing up from a chair using your arms (e.g., wheelchair or bedside chair)?: A Little Help needed to walk in hospital room?: A Little Help needed climbing 3-5 steps with a railing? : A Little 6 Click Score: 19    End of Session Equipment Utilized During Treatment: Gait belt Activity Tolerance: Patient tolerated treatment well;Patient limited by fatigue Patient left: in chair;with call bell/phone within reach;Other (comment) (NT/RN notified pt up in chair) Nurse Communication: Mobility status;Other (comment) (sheets stripped from bed by PTA, pt needs new sheets and may not want  to sit up >1 hour, pt c/o meals not what she wants, pt may be confused and is mixing up events/timelines from previous date) PT Visit Diagnosis: Other abnormalities of gait and mobility (R26.89);Dizziness and giddiness (R42)     Time: 9604-5409 PT Time Calculation (min) (ACUTE ONLY): 21 min  Charges:  $Therapeutic Activity: 8-22 mins                     Amyia Lodwick P., PTA Acute Rehabilitation Services Secure Chat Preferred 9a-5:30pm Office: [phone]    Dorathy Kinsman [facility] 03/15/2022, 12:10 PM

## 2022-03-15 NOTE — Inpatient Diabetes Management (Signed)
Inpatient Diabetes Program Recommendations  AACE/ADA: New Consensus Statement on Inpatient Glycemic Control (2015)  Target Ranges:  Prepandial:   less than 140 mg/dL      Peak postprandial:   less than 180 mg/dL (1-2 hours)      Critically ill patients:  140 - 180 mg/dL   Lab Results  Component Value Date   GLUCAP 244 (H) 03/15/2022   HGBA1C 14.0 (H) 03/10/2022    Review of Glycemic Control  Latest Reference Range & Units 03/14/22 08:27 03/14/22 12:01 03/14/22 17:09 03/14/22 20:41 03/15/22 07:37 03/15/22 11:42  Glucose-Capillary 70 - 99 mg/dL 211 (H) 246 (H) 227 (H) 182 (H) 186 (H) 244 (H)  (H): Data is abnormally high Diabetes history: new onset Outpatient Diabetes medications: none Current orders for Inpatient glycemic control: Levemir 30 units QD, Metformin 500 mg BID, Novolog 0-15 units TID & HS  Inpatient Diabetes Program Recommendations:    Spoke with patient at bedside regarding new onset DM. Patient refusing Novolog and Metformin. States, "I want to control this by limiting my sugar. I can do it. I plan to skip to dinner too." Reviewed patient's current A1c of 14%. Explained what a A1c is and what it measures. Also reviewed goal A1c with patient, importance of good glucose control @ home, and blood sugar goals. Reviewed patho of DM, role of pancreas, need for insulin and improved glycemic control, survival skills, interventions, DKA, starvation ketosis, impact of keto diet with diabetes and associated risks, importance of working to not miss meals, I'm pact of poor glycemic control and covid, vascular changes, and other commorbidities.  Patient will need a meter at discharge. Learning how to check CBGs while inpatient. Blood glucose meter #[id number]. Reviewed recommended frequency and when to call MD.  Admits to drinking and eating poorly with large intake of CHO. Feels that this has contributed to her A1C. We discussed plate method, importance of using CHO as fuel and Carb  counting. Provided patient with some educational resources to further research.  Patient is not interested in insulin, however want to think further about it. Reviewed impact of taking insulin as ordered. Has successfully performed injection while inpatient. Will follow up on 10/20.   Thanks, Bronson Curb, MSN, RNC-OB Diabetes Coordinator [phone] (8a-5p)

## 2022-03-15 NOTE — Progress Notes (Signed)
Patient states she does not like the insulin and feels she is getting too much due to her not eating much. I did educate her on [place] checking her blood sugar before getting insulin and we go off of what the blood sugar says to determine how much insulin she gets. But she she still refused her insulin. I told her that her blood sugars have been high all day yesterday and this morning it was high too. She did take insulin yesterday with me. But today she is refusing and when being educated she still refuses. I have let doctor know and diabetes educator know.

## 2022-03-15 NOTE — Plan of Care (Signed)
  Problem: Education: Goal: Ability to describe self-care measures that may prevent or decrease complications (Diabetes Survival Skills Education) will improve Outcome: Progressing Goal: Individualized Educational Video(s) Outcome: Progressing   Problem: Coping: Goal: Ability to adjust to condition or change in health will improve Outcome: Progressing   Problem: Fluid Volume: Goal: Ability to maintain a balanced intake and output will improve Outcome: Progressing   Problem: Health Behavior/Discharge Planning: Goal: Ability to identify and utilize available resources and services will improve Outcome: Progressing Goal: Ability to manage health-related needs will improve Outcome: Progressing   Problem: Metabolic: Goal: Ability to maintain appropriate glucose levels will improve Outcome: Progressing   Problem: Nutritional: Goal: Maintenance of adequate nutrition will improve Outcome: Progressing Goal: Progress toward achieving an optimal weight will improve Outcome: Progressing   Problem: Skin Integrity: Goal: Risk for impaired skin integrity will decrease Outcome: Progressing   Problem: Tissue Perfusion: Goal: Adequacy of tissue perfusion will improve Outcome: Progressing   Problem: Education: Goal: Ability to describe self-care measures that may prevent or decrease complications (Diabetes Survival Skills Education) will improve Outcome: Progressing Goal: Individualized Educational Video(s) Outcome: Progressing   Problem: Cardiac: Goal: Ability to maintain an adequate cardiac output will improve Outcome: Progressing   Problem: Health Behavior/Discharge Planning: Goal: Ability to identify and utilize available resources and services will improve Outcome: Progressing Goal: Ability to manage health-related needs will improve Outcome: Progressing   Problem: Fluid Volume: Goal: Ability to achieve a balanced intake and output will improve Outcome: Progressing    Problem: Metabolic: Goal: Ability to maintain appropriate glucose levels will improve Outcome: Progressing   Problem: Nutritional: Goal: Maintenance of adequate nutrition will improve Outcome: Progressing Goal: Maintenance of adequate weight for body size and type will improve Outcome: Progressing   Problem: Respiratory: Goal: Will regain and/or maintain adequate ventilation Outcome: Progressing   Problem: Urinary Elimination: Goal: Ability to achieve and maintain adequate renal perfusion and functioning will improve Outcome: Progressing   

## 2022-03-15 NOTE — Progress Notes (Signed)
PROGRESS NOTE    Patricia Schmitt  UEA:[id number] DOB: [birth year] DOA: 03/10/2022 PCP: Pcp, No    Brief Narrative:  Patricia Schmitt is a 56 y/o woman with a history of HTN, DM who presented with 5 days of nausea, vomiting, weakness, dyspnea, cough, congestion, fatigue, myalgias, decrease appetite. She recently traveled to [place] and [place] and her travel companion has RSV. She fell on the floor today and was unable to get up. EMS was called to bring her to the hospital. In the ED she was hypothermic, tachycardic, hypertensive and was found to have severe metabolic acidosis and hyperglycemia. Found to be in DKA. She was given 3.5L fluids per sepsis protocol as well as ceftriaxone and azithromycin. Started on insulin gtt. Blood cultures, UC, covid/flu, RVP obtained in the ED.  She was COVID-positive.  She was admitted initially to ICU. Being treated for COVID without pneumonia and possible otitis 10/14: admitted to [facility] for DKA  TRH pickup on 10/16  10/19 BG elevated, refusing insulin. DM spoke to pt , but appears pt is in denial about her situation.    Consultants:  PCCM  Procedures:   Antimicrobials:      Subjective: Has no new complaints. Denies sob, cp, dizzines  Objective: Vitals:   03/14/22 2017 03/15/22 0333 03/15/22 0457 03/15/22 0742  BP: (!) 153/82  (!) 148/85 (!) 140/94  Pulse: 83  100 100  Resp: 16  16 16   Temp: 98.3 F (36.8 C)  98 F (36.7 C) 98.1 F (36.7 C)  TempSrc: Oral  Oral Oral  SpO2: 97%  98% 96%  Weight:  113 kg 112.9 kg   Height:        Intake/Output Summary (Last 24 hours) at 03/15/2022 1742 Last data filed at 03/14/2022 2355 Gross per 24 hour  Intake --  Output 350 ml  Net -350 ml   Filed Weights   03/14/22 0500 03/15/22 0333 03/15/22 0457  Weight: 113.8 kg 113 kg 112.9 kg    Examination: Calm, NAD Cta no w/r Reg s1/s2 no gallop Soft benign +bs No edema Aaoxox3  Mood and affect appropriate in current setting     Data  Reviewed: I have personally reviewed following labs and imaging studies  CBC: Recent Labs  Lab 03/10/22 1712 03/10/22 1823 03/10/22 2040 03/11/22 0722 03/12/22 0552 03/13/22 0332  WBC 18.2*  --  18.9* 18.0* 13.7* 10.5  NEUTROABS 15.3*  --   --   --  10.8* 7.1  HGB 18.2* 19.7* 16.7* 15.4* 14.5 14.1  HCT 55.1* 58.0* 50.8* 42.8 39.0 38.7  MCV 91.7  --  89.1 82.9 81.3 82.0  PLT 309  --  277 201 203 217   Basic Metabolic Panel: Recent Labs  Lab 03/10/22 2040 03/11/22 0003 03/11/22 0722 03/11/22 1204 03/12/22 0552 03/12/22 0553 03/13/22 0332 03/14/22 1454  NA 131* 134* 133* 134* 136  --  138  --   K 3.5 3.6 3.0* 3.0* 3.4*  --  3.3* 3.8  CL 103 106 108 106 106  --  106  --   CO2 <7* 9* 14* 19* 18*  --  23  --   GLUCOSE 528* 318* 204* 193* 265*  --  233*  --   BUN 26* 23* 20 17 13   --  10  --   CREATININE 1.34* 1.23* 0.91 0.71 0.76  --  0.50  --   CALCIUM 9.0 9.3 9.1 9.0 8.6*  --  8.6*  --   MG 1.9  --   --  1.6*  --  2.3  --   --   PHOS 3.0  --   --   --   --   --   --   --    GFR: Estimated Creatinine Clearance: 95.5 mL/min (by C-G formula based on SCr of 0.5 mg/dL). Liver Function Tests: Recent Labs  Lab 03/10/22 1712 03/11/22 0722  AST 26 15  ALT 20 17  ALKPHOS 150* 90  BILITOT 2.0* 0.5  PROT 8.0 6.2*  ALBUMIN 3.6 2.6*   Recent Labs  Lab 03/10/22 2040 03/12/22 0553  LIPASE 62* 38   Recent Labs  Lab 03/10/22 2040  AMMONIA 26   Coagulation Profile: Recent Labs  Lab 03/10/22 1712  INR 1.3*   Cardiac Enzymes: No results for input(s): "CKTOTAL", "CKMB", "CKMBINDEX", "TROPONINI" in the last 168 hours. BNP (last 3 results) No results for input(s): "PROBNP" in the last 8760 hours. HbA1C: No results for input(s): "HGBA1C" in the last 72 hours. CBG: Recent Labs  Lab 03/14/22 1709 03/14/22 2041 03/15/22 0737 03/15/22 1142 03/15/22 1654  GLUCAP 227* 182* 186* 244* 268*   Lipid Profile: No results for input(s): "CHOL", "HDL", "LDLCALC", "TRIG",  "CHOLHDL", "LDLDIRECT" in the last 72 hours. Thyroid Function Tests: No results for input(s): "TSH", "T4TOTAL", "FREET4", "T3FREE", "THYROIDAB" in the last 72 hours. Anemia Panel: No results for input(s): "VITAMINB12", "FOLATE", "FERRITIN", "TIBC", "IRON", "RETICCTPCT" in the last 72 hours. Sepsis Labs: Recent Labs  Lab 03/10/22 1711 03/10/22 2040 03/11/22 0003 03/11/22 0722 03/12/22 0553  PROCALCITON  --  0.48  --  0.94 0.62  LATICACIDVEN 3.7* 2.2* 2.3*  --   --     Recent Results (from the past 240 hour(s))  Resp Panel by RT-PCR (Flu A&B, Covid) Anterior Nasal Swab     Status: Abnormal   Collection Time: 03/10/22  5:12 PM   Specimen: Anterior Nasal Swab  Result Value Ref Range Status   SARS Coronavirus 2 by RT PCR POSITIVE (A) NEGATIVE Final    Comment: (NOTE) SARS-CoV-2 target nucleic acids are DETECTED.  The SARS-CoV-2 RNA is generally detectable in upper respiratory specimens during the acute phase of infection. Positive results are indicative of the presence of the identified virus, but do not rule out bacterial infection or co-infection with other pathogens not detected by the test. Clinical correlation with patient history and other diagnostic information is necessary to determine patient infection status. The expected result is Negative.  Fact Sheet for Patients: BloggerCourse.com  Fact Sheet for Healthcare Providers: SeriousBroker.it  This test is not yet approved or cleared by the [place] FDA and  has been authorized for detection and/or diagnosis of SARS-CoV-2 by FDA under an Emergency Use Authorization (EUA).  This EUA will remain in effect (meaning this test can be used) for the duration of  the COVID-19 declaration under Section 564(b)(1) of the A ct, 21 U.S.C. section 360bbb-3(b)(1), unless the authorization is terminated or revoked sooner.     Influenza A by PCR NEGATIVE NEGATIVE Final    Influenza B by PCR NEGATIVE NEGATIVE Final    Comment: (NOTE) The Xpert Xpress SARS-CoV-2/FLU/RSV plus assay is intended as an aid in the diagnosis of influenza from Nasopharyngeal swab specimens and should not be used as a sole basis for treatment. Nasal washings and aspirates are unacceptable for Xpert Xpress SARS-CoV-2/FLU/RSV testing.  Fact Sheet for Patients: BloggerCourse.com  Fact Sheet for Healthcare Providers: SeriousBroker.it  This test is not yet approved or cleared by the [place] FDA and has  been authorized for detection and/or diagnosis of SARS-CoV-2 by FDA under an Emergency Use Authorization (EUA). This EUA will remain in effect (meaning this test can be used) for the duration of the COVID-19 declaration under Section 564(b)(1) of the Act, 21 U.S.C. section 360bbb-3(b)(1), unless the authorization is terminated or revoked.  Performed at [facility], [place] [address], [place], [place] [address]   Blood Culture (routine x 2)     Status: None   Collection Time: 03/10/22  5:12 PM   Specimen: BLOOD  Result Value Ref Range Status   Specimen Description BLOOD RIGHT ANTECUBITAL  Final   Special Requests   Final    BOTTLES DRAWN AEROBIC AND ANAEROBIC Blood Culture adequate volume   Culture   Final    NO GROWTH 5 DAYS Performed at [facility], [place] [address], [place], [place] [address]    Report Status 03/15/2022 FINAL  Final  Respiratory (~20 pathogens) panel by PCR     Status: None   Collection Time: 03/10/22  5:12 PM   Specimen: Nasopharyngeal Swab; Respiratory  Result Value Ref Range Status   Adenovirus NOT DETECTED NOT DETECTED Final   Coronavirus 229E NOT DETECTED NOT DETECTED Final    Comment: (NOTE) The Coronavirus on the Respiratory Panel, DOES NOT test for the novel  Coronavirus (2019 nCoV)    Coronavirus HKU1 NOT DETECTED NOT DETECTED Final   Coronavirus NL63 NOT DETECTED NOT DETECTED  Final   Coronavirus OC43 NOT DETECTED NOT DETECTED Final   Metapneumovirus NOT DETECTED NOT DETECTED Final   Rhinovirus / Enterovirus NOT DETECTED NOT DETECTED Final   Influenza A NOT DETECTED NOT DETECTED Final   Influenza B NOT DETECTED NOT DETECTED Final   Parainfluenza Virus 1 NOT DETECTED NOT DETECTED Final   Parainfluenza Virus 2 NOT DETECTED NOT DETECTED Final   Parainfluenza Virus 3 NOT DETECTED NOT DETECTED Final   Parainfluenza Virus 4 NOT DETECTED NOT DETECTED Final   Respiratory Syncytial Virus NOT DETECTED NOT DETECTED Final   Bordetella pertussis NOT DETECTED NOT DETECTED Final   Bordetella Parapertussis NOT DETECTED NOT DETECTED Final   Chlamydophila pneumoniae NOT DETECTED NOT DETECTED Final   Mycoplasma pneumoniae NOT DETECTED NOT DETECTED Final    Comment: Performed at [facility], [place] [address], [place], [place] [address]  Urine Culture     Status: Abnormal   Collection Time: 03/10/22  5:57 PM   Specimen: In/Out Cath Urine  Result Value Ref Range Status   Specimen Description IN/OUT CATH URINE  Final   Special Requests   Final    NONE Performed at [facility], [place] [address], [place], [place] [address]    Culture MULTIPLE SPECIES PRESENT, SUGGEST RECOLLECTION (A)  Final   Report Status 03/12/2022 FINAL  Final  Blood Culture (routine x 2)     Status: None   Collection Time: 03/10/22  8:40 PM   Specimen: BLOOD  Result Value Ref Range Status   Specimen Description BLOOD RIGHT ANTECUBITAL  Final   Special Requests   Final    BOTTLES DRAWN AEROBIC AND ANAEROBIC Blood Culture results may not be optimal due to an inadequate volume of blood received in culture bottles   Culture   Final    NO GROWTH 5 DAYS Performed at [facility], [place] [address] [place], [place] [address]    Report Status 03/15/2022 FINAL  Final  MRSA Next Gen by PCR, Nasal     Status:  None   Collection Time: 03/10/22 10:40 PM   Specimen: Nasal Mucosa; Nasal Swab  Result  Value Ref Range Status   MRSA by PCR Next Gen NOT DETECTED NOT DETECTED Final    Comment: (NOTE) The GeneXpert MRSA Assay (FDA approved for NASAL specimens only), is one component of a comprehensive MRSA colonization surveillance program. It is not intended to diagnose MRSA infection nor to guide or monitor treatment for MRSA infections. Test performance is not FDA approved in patients less than 61 years old. Performed at [facility], [address], [place], [place] [address]          Radiology Studies: No results found.      Scheduled Meds:  amLODipine  10 mg Oral Daily   artificial tears   Both Eyes Q8H   cefdinir  300 mg Oral Q12H   Chlorhexidine Gluconate Cloth  6 each Topical Daily   heparin  5,000 Units Subcutaneous Q8H   insulin aspart  0-15 Units Subcutaneous TID WC   insulin aspart  0-5 Units Subcutaneous QHS   insulin detemir  30 Units Subcutaneous Daily   lisinopril  5 mg Oral Daily   living well with diabetes book   Does not apply Once   metFORMIN  500 mg Oral BID WC   ofloxacin  1 drop Both Eyes QID   ofloxacin  5 drop Left EAR Daily   Continuous Infusions:  Assessment & Plan:   Principal Problem:   DKA (diabetic ketoacidosis) (HCC) Active Problems:   COVID-19 virus infection   AKI (acute kidney injury) (HCC)   Hypokalemia   Hypomagnesemia   Accelerated hypertension   Sepsis on admission likely secondary to severe bilateral conjunctivitis and ear infection versus RSV + COVID No oxygen requirement currently 10/19 continue Omnicef and ofloxacin otic drops       Asymptomatic COVID Has cough only inflammatory markers not checked on admission but as asymptomatic do not expect other issues 10/18 appears received paxlovid and dc/'d 10/19 on room air     New onset diabetes mellitus presented with DKA which was severe She will need insulin teaching--diabetic coordinator RN to follow-up 10/18 BG unstable Diabetic educator following  recommends increase Lantus to 30 units Continue other regimen 10/19 has been declining insulin.  She believes she can do it with just diet.  Diabetic educator tried to educate patient again about this.  A1C 14.0   Uncontrolled hypertension Continue amlodipine and lisinopril   AKI on admission Metabolic acidosis from DKA/AKI Resolving gradually-force oral fluids DC saline recheck labs a.m.   Hypokalemia hypomagnesemia earlier in hospital stay Now resolved with replacement   Morbid obesity BMI 43     DVT prophylaxis: Heparin subcu Code Status: Full Family Communication: None at bedside Disposition Plan:  Status is: Inpatient Remains inpatient appropriate because: IV treatment        LOS: 5 days   Time spent: 35 minutes    Lynn Ito, MD [facility] Pager [phone]  If 7PM-7AM, please contact night-coverage 03/15/2022, 5:42 PM

## 2022-03-15 NOTE — TOC Progression Note (Signed)
Transition of Care [facility]) - Progression Note    Patient Details  Name: Patricia Schmitt MRN: [id number] Date of Birth: January 20, 1966  Transition of Care [facility]) CM/SW [place], RN Phone Number: 03/15/2022, 1:13 PM  Clinical Narrative:    CM met with the patient at the bedside to continue discuss for home health needs for home including RN, PT and OT.  [facility] will be providing diabetes management/ teaching in the home since the patient is new diabetic and needs follow up regarding her insulin administration and blood sugar monitoring.  Inpatient diabetes coordinator and bedside nursing continuing to provide diabetes teaching.   Expected Discharge Plan: [place] Barriers to Discharge: Continued Medical Work up  Expected Discharge Plan and Services Expected Discharge Plan: [place] In-house Referral: PCP / [facility] Discharge Planning Services: CM Consult, Follow-up appt scheduled   Living arrangements for the past 2 months: Single Family Home                           HH Arranged: PT, OT [place] Agency:  ([facility]) Date [place]: 03/13/22 Time [place]: 7639 Representative spoke with at [place]: Gevena Barre, [place] with [facility] [place] [facility] [place] Determinants of Health (SDOH) Interventions    Readmission Risk Interventions     No data to display

## 2022-03-16 ENCOUNTER — Other Ambulatory Visit (HOSPITAL_COMMUNITY): Payer: Self-pay

## 2022-03-16 DIAGNOSIS — U071 COVID-19: Secondary | ICD-10-CM | POA: Diagnosis not present

## 2022-03-16 DIAGNOSIS — N179 Acute kidney failure, unspecified: Secondary | ICD-10-CM | POA: Diagnosis not present

## 2022-03-16 DIAGNOSIS — E1110 Type 2 diabetes mellitus with ketoacidosis without coma: Secondary | ICD-10-CM | POA: Diagnosis not present

## 2022-03-16 DIAGNOSIS — E876 Hypokalemia: Secondary | ICD-10-CM

## 2022-03-16 LAB — BASIC METABOLIC PANEL
Anion gap: 14 (ref 5–15)
BUN: 9 mg/dL (ref 6–20)
CO2: 26 mmol/L (ref 22–32)
Calcium: 8.6 mg/dL — ABNORMAL LOW (ref 8.9–10.3)
Chloride: 97 mmol/L — ABNORMAL LOW (ref 98–111)
Creatinine, Ser: 0.7 mg/dL (ref 0.44–1.00)
GFR, Estimated: >60 mL/min (ref >60)
Glucose, Bld: 250 mg/dL — ABNORMAL HIGH (ref 70–99)
Potassium: 3.5 mmol/L (ref 3.5–5.1)
Sodium: 137 mmol/L (ref 135–145)

## 2022-03-16 LAB — GLUCOSE, CAPILLARY: Glucose-Capillary: 308 mg/dL — ABNORMAL HIGH (ref 70–99)

## 2022-03-16 MED ORDER — ACCU-CHEK SOFTCLIX LANCETS MISC
0 refills | Status: AC
  Filled 2022-03-16: qty 100, 25d supply, fill #0

## 2022-03-16 MED ORDER — CEFDINIR 300 MG PO CAPS
300.0000 mg | ORAL_CAPSULE | Freq: Two times a day (BID) | ORAL | 0 refills | Status: AC
  Filled 2022-03-16: qty 4, 2d supply, fill #0

## 2022-03-16 MED ORDER — ACETAMINOPHEN 325 MG PO TABS: 650.0000 mg | ORAL_TABLET | Freq: Four times a day (QID) | ORAL | Status: AC | PRN

## 2022-03-16 MED ORDER — GLUCOSE BLOOD VI STRP
ORAL_STRIP | 0 refills | Status: AC
  Filled 2022-03-16: qty 100, 25d supply, fill #0

## 2022-03-16 MED ORDER — BLOOD GLUCOSE METER KIT
PACK | 0 refills | Status: AC
  Filled 2022-03-16: qty 1, 30d supply, fill #0

## 2022-03-16 MED ORDER — OFLOXACIN 0.3 % OT SOLN
5.0000 [drp] | Freq: Every day | OTIC | 0 refills | Status: AC
  Filled 2022-03-16: qty 5, 20d supply, fill #0

## 2022-03-16 MED ORDER — AMLODIPINE BESYLATE 10 MG PO TABS
10.0000 mg | ORAL_TABLET | Freq: Every day | ORAL | 0 refills | Status: AC
  Filled 2022-03-16: qty 30, 30d supply, fill #0

## 2022-03-16 MED ORDER — OFLOXACIN 0.3 % OP SOLN
1.0000 [drp] | Freq: Four times a day (QID) | OPHTHALMIC | 0 refills | Status: AC
  Filled 2022-03-16: qty 5, 25d supply, fill #0

## 2022-03-16 NOTE — Inpatient Diabetes Management (Signed)
Inpatient Diabetes Program Recommendations  AACE/ADA: New Consensus Statement on Inpatient Glycemic Control (2015)  Target Ranges:  Prepandial:   less than 140 mg/dL      Peak postprandial:   less than 180 mg/dL (1-2 hours)      Critically ill patients:  140 - 180 mg/dL   Lab Results  Component Value Date   GLUCAP 242 (H) 03/15/2022   HGBA1C 14.0 (H) 03/10/2022    Diabetes history: new onset Outpatient Diabetes medications: none Current orders for Inpatient glycemic control: Levemir 30 units QD, Metformin 500 mg BID, Novolog 0-15 units TID & HS   Inpatient Diabetes Program Recommendations:    Patient continues to refuse insulin for diabetes despite education.   Spoke with patient again to reinforce conversation yesterday. Patient has no questions and is adamant about not using oral medications nor injections at this time. Feels she can do it "natural" despite education.  Pointed out to patient that her trends are exceeding target goals even with her "natural" remedies of apple juice and carb counting. Further education given. Patient states, "I understand what your saying and would like to try it my way first."  Discussed with Dr Algis Liming. Patient has a follow up appointment scheduled.  Patient will need a meter at discharge. Learning how to check CBGs while inpatient. Blood glucose meter #[id number]. No further questions at this time and understands risks associated from poor glycemic control.   Thanks, Bronson Curb, MSN, RNC-OB Diabetes Coordinator [phone] (8a-5p)

## 2022-03-16 NOTE — Progress Notes (Signed)
Patient stated boyfriend is not available to come get her to go home and requested to stay another night. I let doctor know he stated that her discharge still stands and it is on her to leave for tomorrow. Patient is aware of this and boyfriend is aware as well. Will come get patient tomorrow morning to be discharged.

## 2022-03-16 NOTE — Discharge Summary (Signed)
Physician Discharge Summary  Patricia Schmitt QPR:[id number] DOB: 1965/09/14  PCP: Pcp, No  Admitted from: Home Discharged to: Home  Admit date: 03/10/2022 Discharge date: 03/16/2022  Recommendations for Outpatient Follow-up:    Follow-up Information     Gildardo Pounds, NP Follow up.   Specialty: Nurse Practitioner Why: You are also scheduled for an appointment on May 01, 2022 at 2:30 pm. Contact information: [place] [address] [phone]         [facility] Follow up.   Specialty: Home Health Services Why: [facility] will provide you with home health services for physical therapy and occupational therapy.  They will call you within 24-48 hours of your discharge home to schedule an initial visit. Contact information: [place] [address] [place] Follow up.   Specialty: Internal Medicine Why: You are schedule for a hospital follow up at [facility] on 03/23/2022 at 10 am.  You are schedule with [name], NP for a hospital follow up. Contact information: [place] [id number] [place] [phone]        Leta Baptist, MD. Schedule an appointment as soon as possible for a visit.   Specialty: Otolaryngology Why: Call for an appointment to be seen as soon as possible for your bilateral hearing problem. Contact information: [place] [address] [facility]. Schedule an appointment as soon as possible for a visit.   Why: Call for an appointment to be seen as soon as possible for your red and itching eyes. Contact information: Address: [place], [place], [place] [address]  Phone: [phone]                 Home Health: [facility] (From admission, onward)     Start     Ordered   03/15/22 [place]  At discharge        Question Answer Comment  To provide the following care/treatments PT   To provide the following care/treatments OT   To provide the following care/treatments RN      03/15/22 1310             Equipment/Devices: Rolling walker with 2 wheels    Discharge Condition: Improved and stable but at high risk for recurrent decline including death due to patient's refusal to take insulins or even oral hypoglycemics for her newly diagnosed diabetes.  She has been warned about this risks and verbalized understanding.   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  She recently traveled to [place] in [place] and her travel companion had RSV.  She fell on the floor on day of admission and was unable to get up.  EMS was called to bring her to the hospital.  In the ED, she was hypothermic, tachycardic, hypertensive and was found to have severe metabolic acidosis and hyperglycemia.  She was found to be in DKA.  She was given 3.5 L fluids per sepsis protocol and ceftriaxone and azithromycin.  She was started on insulin  drip.  Blood cultures, urine cultures, COVID/flu, RVP were obtained in the ED.  PCCM admitted the patient to ICU.    Assessment and plan:  DKA with severe acidosis/new onset poorly controlled DM:  Admission labs: Lactate 3.7, sodium 127, potassium 5.2, HCO3 <7, glucose 697, BUN 27, creatinine 1.71, alkaline phosphatase 150, total bilirubin 2, WBC 18.2, beta hydroxybutyrate >8, i-STAT hCG quantitative <5, SARS coronavirus 2 RT-PCR positive, flu panel negative.  Hemoglobin A1c: 14 on 03/10/2022.  She was treated per DKA protocol with aggressive IV fluids, insulin drip per Endo tool protocol, serial monitoring of BMP.  She was then transitioned to Levemir and SSI.  Diabetes coordinator was consulted.  Doses of insulins were titrated.  It appears that patient took the insulin for the first 3 to 4 days but since then has essentially refused all insulins.  She was started on metformin which she now refuses to take at discharge. Despite multiple providers and DM coordinator counseling in the past and my discussion with her extensively this morning, patient insists on not taking any insulins or oral hypoglycemic prescription medications for her poorly controlled diabetes despite warning her of dangerous risks of recurrent DKA, multiple other medical complications, including death.  She insists on trying to manage her DM by lifestyle modifications.  I advised her in no uncertain terms that it is unlikely for her DM to be controlled on lifestyle modifications alone (at least in the short-term) and will need medications as noted above.  She has the capacity to make medical decisions but believe that she is making poor judgment.  She has been advised to seek immediate medical attention if there is any decline in her  medical condition or if she changes her mind and decides to take insulins or oral meds for her diabetes.  She verbalized understanding.  Glucometer has been ordered for her to monitor her blood glucose levels.   Patient also refuses to take "ACE inhibitors" because they make her nauseous and is insisting on beta-blockers-defer to outpatient follow-up.  COVID infection without pneumonia: Possible otitis and conjunctivitis, bilateral: Sepsis documented in provider note on 03/12/2022, likely due to COVID-19 infection.  Sepsis POA and met criteria on admission. As per PCCM, PCT was only 0.41 and they did not think that she had performed bacterial infection.  They discontinued antibiotics that were empirically started for community-acquired pneumonia.  They initiated ofloxacin ER and eyedrops.  She was then started on Omnicef twice daily for possible ear infection and will complete total 7 days course.  She completed a 4-day course of Paxlovid and was discontinued. Patient reports that her eyes are getting better, has been clearly counseled to see an outpatient ophthalmology as soon as possible.. Discussed in detail with ENT MD on-call.  He  indicated that in the absence of otorrhea, no indication for eardrops (however as per CCM signoff note on 10/15, noted exudative material in left ear canal, and hence will complete 10-day course of antibiotic eardrops).  He suspects that she has middle ear fluid collection from COVID-19 infection/URTI.  This is contributing to her hearing impairment bilaterally in the absence of earache.  He indicates that this will gradually improve and also recommends outpatient ENT follow-up.  Patient has been counseled to see an outpatient ENT as soon as possible.  Contact details for ENT and ophthalmology included in her discharge follow-ups. Per prior [facility] provider otoscopic exam on 10/17: Right ear seems to have air-fluid level, left ear view obscured by eardrops that had been placed and he was unable to clearly see the tympanic membrane. The otitis and conjunctivitis may be viral related to COVID-19 and less likely bacterial.  Essential hypertension: Continue amlodipine 10 Mg daily.   Discontinued lisinopril on patient's request.  This can be considered during outpatient follow-up with PCP for renal protection from her diabetes.  This was explained but she continues to refuse.  Minimally elevated troponin with flat trend, 18 > 23 likely demand ischemia.  TTE showed LVEF 88-91%, grade 1 diastolic dysfunction and no aortic stenosis present.  Acute kidney injury:  Secondary to dehydration and DKA on admission.  Resolved.  Hypokalemia/hypomagnesemia: Replaced.  Pseudohyponatremia: Secondary to hyperglycemia, resolved.  Lactic acidosis: Secondary to presentation with DKA, dehydration, AKI.  Treated as above.  GERD: Asymptomatic.  No PPI at discharge.  Leukocytosis: Suspect multifactorial related to DKA, stress response etc.  Resolved.  Body mass index is 42.53 kg/m./Morbid obesity: Lifestyle modifications and weight loss.    Consultations: Diabetes coordinator  Procedures: None   Discharge Instructions  Discharge Instructions     Amb Referral to Nutrition and Diabetic Education   Complete by: As directed    Call MD for:   Complete by: As directed    Earache, worsening hearing loss, increased pain or redness in the eyes or visual disturbances.   Call MD for:  difficulty breathing, headache or visual disturbances   Complete by: As directed    Call MD for:  extreme fatigue   Complete by: As directed    Call MD for:  persistant dizziness or light-headedness   Complete by: As directed    Call MD for:  persistant nausea and vomiting   Complete by: As directed    Call MD for:  severe uncontrolled pain   Complete by: As directed    Call MD for:  temperature >100.4   Complete by: As directed    Diet - low sodium heart healthy   Complete by: As directed    Diet Carb Modified   Complete by: As directed    Increase activity slowly   Complete by: As directed         Medication List     STOP taking these medications    aspirin EC 81 MG tablet    TYLENOL CHILDRENS CHEWABLES PO Replaced by: acetaminophen 325 MG tablet       TAKE these medications    acetaminophen 325 MG tablet Commonly known as: TYLENOL Take 2 tablets (650 mg total) by mouth every 6 (six) hours as needed for mild pain, moderate pain, fever or headache. Replaces: TYLENOL CHILDRENS CHEWABLES PO   amLODipine 10 MG tablet Commonly known as: NORVASC Take 1 tablet (10 mg total) by mouth daily. Start taking on: March 17, 2022  blood glucose meter kit and supplies Dispense based on patient and insurance preference. Use up to four times daily as directed. (FOR ICD-10 E10.9, E11.9).   cefdinir 300 MG capsule Commonly known as: OMNICEF Take 1 capsule (300 mg total) by mouth 2 (two) times daily for 2 days.   ofloxacin 0.3 % ophthalmic solution Commonly known as: OCUFLOX Place 1 drop into both eyes 4 (four) times daily for 5 days.   ofloxacin 0.3 % ophthalmic solution Commonly known as: OCUFLOX Place 5 drops into the left ear daily for 5 days. Start taking on: March 17, 2022   OVER THE COUNTER MEDICATION Take 3 capsules by mouth daily. Balance of Nature Fruits   OVER THE COUNTER MEDICATION Take 3 capsules by mouth daily. Balance of Nature Veggies       Allergies  Allergen Reactions   Ace Inhibitors Other (See Comments)    Dizziness Balance issues   Amoxil [Amoxicillin] Other (See Comments)    Stomach cramps   Erythromycin Other (See Comments)    Stomach cramps   Hydrochlorothiazide Other (See Comments)    Pt unsure of reaction   Ivp Dye [Iodinated Contrast Media] Nausea And Vomiting   Penicillins Nausea And Vomiting      Procedures/Studies: ECHOCARDIOGRAM COMPLETE  Result Date: 03/12/2022    ECHOCARDIOGRAM REPORT   Patient Name:   Patricia Schmitt Date of Exam: 03/12/2022 Medical Rec #:  [id number]        Height:       64.0 in Accession #:    [id number]       Weight:       250.9 lb Date of Birth:  1966-01-04        BSA:          2.154 m  Patient Age:    85 years         BP:           159/100 mmHg Patient Gender: F                HR:           106 bpm. Exam Location:  Inpatient Procedure: 2D Echo, Cardiac Doppler and Color Doppler Indications:    Cardiomegaly I51.7  History:        Patient has no prior history of Echocardiogram examinations.                 COVID, Signs/Symptoms:Dyspnea; Risk Factors:Diabetes, Non-Smoker                 and Hypertension.  Sonographer:    Greer Pickerel Referring Phys: [id number] Candee Furbish  Sonographer Comments: Technically difficult study due to poor echo windows, Technically challenging study due to limited acoustic windows, patient is obese, suboptimal subcostal window, suboptimal parasternal window and suboptimal apical window. Image acquisition challenging due to respiratory motion. Patient refused definity. IMPRESSIONS  1. Left ventricular ejection fraction, by estimation, is 60 to 65%. The left ventricle has normal function. The left ventricle has no regional wall motion abnormalities. There is mild concentric left ventricular hypertrophy. Left ventricular diastolic parameters are consistent with Grade I diastolic dysfunction (impaired relaxation).  2. Right ventricular systolic function is normal. The right ventricular size is normal. Tricuspid regurgitation signal is inadequate for assessing PA pressure.  3. The mitral valve is normal in structure. Trivial mitral valve regurgitation.  4. The aortic valve is tricuspid. Aortic valve regurgitation is not visualized. No aortic stenosis is present.  5. The inferior  vena cava is normal in size with greater than 50% respiratory variability, suggesting right atrial pressure of 3 mmHg. Comparison(s): No prior Echocardiogram. FINDINGS  Left Ventricle: Left ventricular ejection fraction, by estimation, is 60 to 65%. The left ventricle has normal function. The left ventricle has no regional wall motion abnormalities. The left ventricular internal cavity size was normal  in size. There is  mild concentric left ventricular hypertrophy. Left ventricular diastolic parameters are consistent with Grade I diastolic dysfunction (impaired relaxation). Right Ventricle: The right ventricular size is normal. No increase in right ventricular wall thickness. Right ventricular systolic function is normal. Tricuspid regurgitation signal is inadequate for assessing PA pressure. Left Atrium: Left atrial size was normal in size. Right Atrium: Right atrial size was normal in size. Pericardium: There is no evidence of pericardial effusion. Mitral Valve: The mitral valve is normal in structure. Trivial mitral valve regurgitation. Tricuspid Valve: The tricuspid valve is normal in structure. Tricuspid valve regurgitation is trivial. Aortic Valve: The aortic valve is tricuspid. Aortic valve regurgitation is not visualized. No aortic stenosis is present. Pulmonic Valve: The pulmonic valve was normal in structure. Pulmonic valve regurgitation is trivial. Aorta: The aortic root and ascending aorta are structurally normal, with no evidence of dilitation. Venous: The inferior vena cava is normal in size with greater than 50% respiratory variability, suggesting right atrial pressure of 3 mmHg. IAS/Shunts: The atrial septum is grossly normal.  LEFT VENTRICLE PLAX 2D LVIDd:         4.10 cm   Diastology LVIDs:         2.70 cm   LV e' medial:    6.96 cm/s LV PW:         0.90 cm   LV E/e' medial:  10.5 LV IVS:        0.90 cm   LV e' lateral:   5.87 cm/s LVOT diam:     1.70 cm   LV E/e' lateral: 12.4 LV SV:         51 LV SV Index:   24 LVOT Area:     2.27 cm  LEFT ATRIUM           Index        RIGHT ATRIUM           Index LA Vol (A2C): 30.1 ml 13.97 ml/m  RA Area:     13.60 cm LA Vol (A4C): 56.5 ml 26.23 ml/m  RA Volume:   29.90 ml  13.88 ml/m  AORTIC VALVE LVOT Vmax:   152.00 cm/s LVOT Vmean:  99.400 cm/s LVOT VTI:    0.225 m  AORTA Ao Root diam: 3.10 cm Ao Asc diam:  2.95 cm MITRAL VALVE MV Area (PHT): 4.96 cm     SHUNTS MV Decel Time: 153 msec    Systemic VTI:  0.22 m MV E velocity: 72.80 cm/s  Systemic Diam: 1.70 cm MV A velocity: 99.40 cm/s MV E/A ratio:  0.73 Gwyndolyn Kaufman MD Electronically signed by Gwyndolyn Kaufman MD Signature Date/Time: 03/12/2022/7:08:33 PM    Final    DG Chest Port 1 View  Result Date: 03/10/2022 CLINICAL DATA:  Questionable sepsis EXAM: PORTABLE CHEST 1 VIEW COMPARISON:  None Available. FINDINGS: Cardiomegaly. Both lungs are clear. The visualized skeletal structures are unremarkable. IMPRESSION: Cardiomegaly without acute abnormality of the lungs in AP projection. Electronically Signed   By: Delanna Ahmadi M.D.   On: 03/10/2022 17:30      Subjective: Patient hard of hearing, left greater than  right.  Had to speak close to her right ear and loudly to be heard.  She denies earache or pain in the eyes.  No drainage from the ears or eyes.  The hearing and vision problems started just prior to admission.  She feels that her eye redness has significantly improved and eyes are returning to her baseline.  She denies cough, chest pain, fever or dyspnea.  She is unhappy about multiple aspects of care in the hospital including lack of prompt attention when she needs something, diet choices etc.  She continues to insist that she can control her DM by diet and lifestyle modifications and insists on not taking oral hypoglycemics or insulins.  Discharge Exam:  Vitals:   03/16/22 0108 03/16/22 0358 03/16/22 0846 03/16/22 1315  BP:  130/72 (!) 149/83 (!) 150/72  Pulse:  97 92 64  Resp:  _0 Temp:  97.9 F (36.6 C) 98.5 F (36.9 C) 99.1 F (37.3 C)  TempSrc:  Oral Oral Oral  SpO2:  96% 99% 97%  Weight: 112.4 kg     Height:        General: Young female, moderately built and obese lying comfortably supine in bed without distress.  Oral mucosa moist. Eyes: Faint bilateral mild conjunctival congestion.  No photophobia. PERTLA. ENT: Decreased bilateral hearing, left >right.   Otoscopic exam as per prior provider on 10/15 and moreover did not have a otoscope on the patient's floor. Cardiovascular: S1 & S2 heard, RRR, S1/S2 +. No murmurs, rubs, gallops or clicks. No JVD or pedal edema. Respiratory: Clear to auscultation without wheezing, rhonchi or crackles. No increased work of breathing. Abdominal:  Non distended, non tender & soft. No organomegaly or masses appreciated. Normal bowel sounds heard. CNS: Alert and oriented. No focal deficits. Extremities: no edema, no cyanosis    The results of significant diagnostics from this hospitalization (including imaging, microbiology, ancillary and laboratory) are listed below for reference.     Microbiology: Recent Results (from the past 240 hour(s))  Resp Panel by RT-PCR (Flu A&B, Covid) Anterior Nasal Swab     Status: Abnormal   Collection Time: 03/10/22  5:12 PM   Specimen: Anterior Nasal Swab  Result Value Ref Range Status   SARS Coronavirus 2 by RT PCR POSITIVE (A) NEGATIVE Final    Comment: (NOTE) SARS-CoV-2 target nucleic acids are DETECTED.  The SARS-CoV-2 RNA is generally detectable in upper respiratory specimens during the acute phase of infection. Positive results are indicative of the presence of the identified virus, but do not rule out bacterial infection or co-infection with other pathogens not detected by the test. Clinical correlation with patient history and other diagnostic information is necessary to determine patient infection status. The expected result is Negative.  Fact Sheet for Patients: EntrepreneurPulse.com.au  Fact Sheet for Healthcare Providers: IncredibleEmployment.be  This test is not yet approved or cleared by the [place] FDA and  has been authorized for detection and/or diagnosis of SARS-CoV-2 by FDA under an Emergency Use Authorization (EUA).  This EUA will remain in effect (meaning this test can be used) for the duration of  the  COVID-19 declaration under Section 564(b)(1) of the A ct, 21 U.S.C. section 360bbb-3(b)(1), unless the authorization is terminated or revoked sooner.     Influenza A by PCR NEGATIVE NEGATIVE Final   Influenza B by PCR NEGATIVE NEGATIVE Final    Comment: (NOTE) The Xpert Xpress SARS-CoV-2/FLU/RSV plus assay is intended as an aid in the diagnosis of  influenza from Nasopharyngeal swab specimens and should not be used as a sole basis for treatment. Nasal washings and aspirates are unacceptable for Xpert Xpress SARS-CoV-2/FLU/RSV testing.  Fact Sheet for Patients: EntrepreneurPulse.com.au  Fact Sheet for Healthcare Providers: IncredibleEmployment.be  This test is not yet approved or cleared by the [place] FDA and has been authorized for detection and/or diagnosis of SARS-CoV-2 by FDA under an Emergency Use Authorization (EUA). This EUA will remain in effect (meaning this test can be used) for the duration of the COVID-19 declaration under Section 564(b)(1) of the Act, 21 U.S.C. section 360bbb-3(b)(1), unless the authorization is terminated or revoked.  Performed at [facility], [place] [address] [place], [place] [address]   Blood Culture (routine x 2)     Status: None   Collection Time: 03/10/22  5:12 PM   Specimen: BLOOD  Result Value Ref Range Status   Specimen Description BLOOD RIGHT ANTECUBITAL  Final   Special Requests   Final    BOTTLES DRAWN AEROBIC AND ANAEROBIC Blood Culture adequate volume   Culture   Final    NO GROWTH 5 DAYS Performed at [facility], [place] [address] [place], [place] [address]    Report Status 03/15/2022 FINAL  Final  Respiratory (~20 pathogens) panel by PCR     Status: None   Collection Time: 03/10/22  5:12 PM   Specimen: Nasopharyngeal Swab; Respiratory  Result Value Ref Range Status   Adenovirus NOT DETECTED NOT DETECTED Final   Coronavirus 229E NOT DETECTED NOT DETECTED Final    Comment:  (NOTE) The Coronavirus on the Respiratory Panel, DOES NOT test for the novel  Coronavirus (2019 nCoV)    Coronavirus HKU1 NOT DETECTED NOT DETECTED Final   Coronavirus NL63 NOT DETECTED NOT DETECTED Final   Coronavirus OC43 NOT DETECTED NOT DETECTED Final   Metapneumovirus NOT DETECTED NOT DETECTED Final   Rhinovirus / Enterovirus NOT DETECTED NOT DETECTED Final   Influenza A NOT DETECTED NOT DETECTED Final   Influenza B NOT DETECTED NOT DETECTED Final   Parainfluenza Virus 1 NOT DETECTED NOT DETECTED Final   Parainfluenza Virus 2 NOT DETECTED NOT DETECTED Final   Parainfluenza Virus 3 NOT DETECTED NOT DETECTED Final   Parainfluenza Virus 4 NOT DETECTED NOT DETECTED Final   Respiratory Syncytial Virus NOT DETECTED NOT DETECTED Final   Bordetella pertussis NOT DETECTED NOT DETECTED Final   Bordetella Parapertussis NOT DETECTED NOT DETECTED Final   Chlamydophila pneumoniae NOT DETECTED NOT DETECTED Final   Mycoplasma pneumoniae NOT DETECTED NOT DETECTED Final    Comment: Performed at [facility], [place] [address], [place], [place] [address]  Urine Culture     Status: Abnormal   Collection Time: 03/10/22  5:57 PM   Specimen: In/Out Cath Urine  Result Value Ref Range Status   Specimen Description IN/OUT CATH URINE  Final   Special Requests   Final    NONE Performed at [facility], [place] [address], [place], [place] [address]    Culture MULTIPLE SPECIES PRESENT, SUGGEST RECOLLECTION (A)  Final   Report Status 03/12/2022 FINAL  Final  Blood Culture (routine x 2)     Status: None   Collection Time: 03/10/22  8:40 PM   Specimen: BLOOD  Result Value Ref Range Status   Specimen Description BLOOD RIGHT ANTECUBITAL  Final   Special Requests   Final    BOTTLES DRAWN AEROBIC AND ANAEROBIC Blood Culture results may not be optimal due to  an inadequate volume of blood received in culture bottles   Culture   Final    NO GROWTH 5 DAYS Performed at [facility], [place] [address], [place], [place] [address]    Report Status 03/15/2022 FINAL  Final  MRSA Next Gen by PCR, Nasal     Status: None   Collection Time: 03/10/22 10:40 PM   Specimen: Nasal Mucosa; Nasal Swab  Result Value Ref Range Status   MRSA by PCR Next Gen NOT DETECTED NOT DETECTED Final    Comment: (NOTE) The GeneXpert MRSA Assay (FDA approved for NASAL specimens only), is one component of a comprehensive MRSA colonization surveillance program. It is not intended to diagnose MRSA infection nor to guide or monitor treatment for MRSA infections. Test performance is not FDA approved in patients less than 27 years old. Performed at [facility], [place] [address], [place],  [address]      Labs: CBC: Recent Labs  Lab 03/10/22 1712 03/10/22 1823 03/10/22 2040 03/11/22 0722 03/12/22 0552 03/13/22 0332  WBC 18.2*  --  18.9* 18.0* 13.7* 10.5  NEUTROABS 15.3*  --   --   --  10.8* 7.1  HGB 18.2* 19.7* 16.7* 15.4* 14.5 14.1  HCT 55.1* 58.0* 50.8* 42.8 39.0 38.7  MCV 91.7  --  89.1 82.9 81.3 82.0  PLT 309  --  277 201 203 564    Basic Metabolic Panel: Recent Labs  Lab 03/10/22 2040 03/11/22 0003 03/11/22 0722 03/11/22 1204 03/12/22 0552 03/12/22 0553 03/13/22 0332 03/14/22 1454 03/16/22 0310  NA 131*   < > 133* 134* 136  --  138  --  137  K 3.5   < > 3.0* 3.0* 3.4*  --  3.3* 3.8 3.5  CL 103   < > 108 106 106  --  106  --  97*  CO2 <7*   < > 14* 19* 18*  --  23  --  26  GLUCOSE 528*   < > 204* 193* 265*  --  233*  --  250*  BUN 26*   < > _0 --  10  --  9  CREATININE 1.34*   < > 0.91 0.71 0.76  --  0.50  --  0.70  CALCIUM 9.0   < > 9.1 9.0 8.6*  --  8.6*  --  8.6*  MG 1.9  --   --  1.6*  --  2.3  --   --   --   PHOS 3.0  --   --   --   --   --   --   --   --    < > = values in this interval not displayed.    Liver Function Tests: Recent Labs  Lab 03/10/22 1712 03/11/22 0722  AST 26 15  ALT 20 17  ALKPHOS 150* 90  BILITOT 2.0* 0.5  PROT 8.0 6.2*  ALBUMIN 3.6  2.6*    CBG: Recent Labs  Lab 03/15/22 0737 03/15/22 1142 03/15/22 1654 03/15/22 2232 03/16/22 1221  GLUCAP 186* 244* 268* 242* 308*     Urinalysis    Component Value Date/Time   COLORURINE YELLOW 03/10/2022 1757   APPEARANCEUR HAZY (A) 03/10/2022 1757   APPEARANCEUR Clear 09/05/2012 1213   LABSPEC 1.021 03/10/2022 1757   LABSPEC 1.014 09/05/2012 1213   PHURINE 5.0 03/10/2022 1757   GLUCOSEU >=500 (A) 03/10/2022 1757   GLUCOSEU Negative 09/05/2012 1213   HGBUR  SMALL (A) 03/10/2022 1757   BILIRUBINUR NEGATIVE 03/10/2022 1757   BILIRUBINUR Negative 09/05/2012 1213   KETONESUR 80 (A) 03/10/2022 1757   PROTEINUR 100 (A) 03/10/2022 1757   NITRITE NEGATIVE 03/10/2022 1757   LEUKOCYTESUR NEGATIVE 03/10/2022 1757   LEUKOCYTESUR Negative 09/05/2012 1213      Time coordinating discharge: 40 minutes  SIGNED:  Vernell Leep, MD,  FACP, [place], SF[facility], [facility], [place]     To contact the attending provider between 7A-7P or the covering provider during after hours 7P-7A, please log into the web site [email or web address] and access using universal  password for that web site. If you do not have the password, please call the hospital operator.

## 2022-03-16 NOTE — TOC Transition Note (Signed)
Transition of Care [facility]) - CM/SW Discharge Note   Patient Details  Name: Patricia Schmitt MRN: [id number] Date of Birth: 1965-11-11  Transition of Care [facility]) CM/SW Contact:  Amador Cunas, [place] Phone Number: 03/16/2022, 4:13 PM   Clinical Narrative:   pt for dc home with HHPT/OT/RN through [facility] Notified Amy with [facility] of dc. SW signing off at dc.   Wandra Feinstein, MSW, LCSW [phone] (coverage)      Final next level of care: Home w Home Health Services Barriers to Discharge: No Barriers Identified   Patient Goals and CMS Choice Patient states their goals for this hospitalization and ongoing recovery are:: To return home CMS Medicare.gov Compare Post Acute Care list provided to:: Patient Choice offered to / list presented to : Patient  Discharge Placement                       Discharge Plan and Services In-house Referral: PCP / Health Connect Discharge Planning Services: CM Consult, Follow-up appt scheduled                      HH Arranged: RN, PT, OT [facility] Agency: [place] Date [facility] Agency Contacted: 03/13/22 Time [place]: 1962 Representative spoke with at [place]: Gevena Barre, [place] with [facility] [place] Determinants of Health (SDOH) Interventions     Readmission Risk Interventions     No data to display

## 2022-03-16 NOTE — Progress Notes (Signed)
Patients boyfriend decided to come pick up patient. He came and brought her clothes and we let patient know. She was on board and ready to leave. But due to doctor not completing med reconciliation I could not print out patients discharge summary and doctor already left for the day. Therefore patient cannot leave until tomorrow until med rec is done and boyfriend is aware and said he will be here tomorrow morning.

## 2022-03-16 NOTE — Progress Notes (Signed)
Occupational Therapy Treatment Patient Details Name: Patricia Schmitt MRN: [id number] DOB: 04/03/1966 Today's Date: 03/16/2022   History of present illness 56 year old female admitted 03/10/22 with HTN , RSV+COVID, cough, myalgias and had a fall. Dx with sepsis, New onset diabetes mellitus presented with DKA which was severe, Metabolic acidosis from DKA/AKI. PMH includes: Hypertension, Meningitis spinal, Obesity, Abdominal hysterectomy (2007).   OT comments  Pt making excellent progress towards OT goals, able to mobilize to/from bathroom without AD and without assistance (some furniture walking noted). Pt able to manage toileting tasks with Modified [place]. Pt reports plan to DC home today, as well as plan to not take insulin. Continue to feel HHOT follow up would be helpful to assist pt in returning to ADL/IADL routine, as well as med mgmt.    Recommendations for follow up therapy are one component of a multi-disciplinary discharge planning process, led by the attending physician.  Recommendations may be updated based on patient status, additional functional criteria and insurance authorization.    Follow Up Recommendations  Home health OT    Assistance Recommended at Discharge PRN  Patient can return home with the following  A little help with bathing/dressing/bathroom;Assistance with cooking/housework;Direct supervision/assist for financial management;Direct supervision/assist for medications management   Equipment Recommendations  None recommended by OT    Recommendations for Other Services      Precautions / Restrictions Precautions Precautions: Fall Precaution Comments: COVID Restrictions Weight Bearing Restrictions: No       Mobility Bed Mobility Overal bed mobility: Modified Independent Bed Mobility: Supine to Sit, Sit to Supine                Transfers Overall transfer level: Independent Equipment used: None Transfers: Sit to/from Stand Sit to Stand:  Independent           General transfer comment: easily stood at EOB     Balance Overall balance assessment: Needs assistance Sitting-balance support: Feet supported, No upper extremity supported Sitting balance-Leahy Scale: Good     Standing balance support: No upper extremity supported, During functional activity Standing balance-Leahy Scale: Good                             ADL either performed or assessed with clinical judgement   ADL Overall ADL's : Needs assistance/impaired Eating/Feeding: Independent;Bed level   Grooming: Set up;Standing;Wash/dry hands           Upper Body Dressing : Modified independent;Sitting       Toilet Transfer: Supervision/safety;Ambulation Toilet Transfer Details (indicate cue type and reason): able to ambulate to bathrroom without AD and without assist Toileting- Clothing Manipulation and Hygiene: Modified independent;Sitting/lateral lean;Sit to/from stand       Functional mobility during ADLs: Supervision/safety General ADL Comments: Pt reports feeling better today, able to mobilize with RW to/from bathroom and manage ADLs without assist. Encouraged bathroom or BSC use rather than purewick. Still question some cognitive deficits though appears functional    Extremity/Trunk Assessment Upper Extremity Assessment Upper Extremity Assessment: Overall WFL for tasks assessed   Lower Extremity Assessment Lower Extremity Assessment: Defer to PT evaluation        Vision   Vision Assessment?: No apparent visual deficits   Perception     Praxis      Cognition Arousal/Alertness: Awake/alert Behavior During Therapy: Flat affect Overall Cognitive Status: Difficult to assess  General Comments: Flat affect, shows insight into deficits though difficult to fully assess cognition due to very HOH. Pt with some questionable confusion as she reported laying in urine all night/this  morning due to purewick malfunction though note this is also what pt reported to PTA in prior session.        Exercises Exercises: Other exercises Other Exercises Other Exercises: IS x 5 up to    Shoulder Instructions       General Comments      Pertinent Vitals/ Pain       Pain Assessment Pain Assessment: Faces Faces Pain Scale: Hurts a little bit Pain Location: bottom Pain Descriptors / Indicators: Sore Pain Intervention(s): Monitored during session  Home Living                                          Prior Functioning/Environment              Frequency  Min 2X/week        Progress Toward Goals  OT Goals(current goals can now be found in the care plan section)  Progress towards OT goals: Progressing toward goals  Acute Rehab OT Goals Patient Stated Goal: continue to feel better, avoid taking insulin OT Goal Formulation: With patient Time For Goal Achievement: 03/27/22 Potential to Achieve Goals: Good ADL Goals Pt Will Perform Grooming: with supervision;standing Pt Will Perform Upper Body Dressing: with set-up;sitting Pt Will Perform Lower Body Dressing: with set-up;sit to/from stand Pt Will Transfer to Toilet: with supervision;ambulating Pt Will Perform Toileting - Clothing Manipulation and hygiene: with set-up;sit to/from stand Additional ADL Goal #1: Pt will verbalize 3 ways of conserving energy during ADL routine at independent level  Plan Discharge plan remains appropriate    Co-evaluation                 AM-PAC OT "6 Clicks" Daily Activity     Outcome Measure   Help from another person eating meals?: None Help from another person taking care of personal grooming?: A Little Help from another person toileting, which includes using toliet, bedpan, or urinal?: A Little Help from another person bathing (including washing, rinsing, drying)?: A Little Help from another person to put on and taking off regular upper  body clothing?: A Little Help from another person to put on and taking off regular lower body clothing?: A Little 6 Click Score: 19    End of Session    OT Visit Diagnosis: Unsteadiness on feet (R26.81);Other abnormalities of gait and mobility (R26.89);Muscle weakness (generalized) (M62.81);History of falling (Z91.81);Dizziness and giddiness (R42)   Activity Tolerance Patient tolerated treatment well   Patient Left in bed;with call bell/phone within reach   Nurse Communication Mobility status        Time: 2355-7322 OT Time Calculation (min): 21 min  Charges: OT General Charges $OT Visit: 1 Visit OT Treatments $Self Care/Home Management : 8-22 mins  Bradd Canary, OTR/L Acute Rehab Services Office: [phone]   Lorre Munroe 03/16/2022, 11:04 AM

## 2022-03-16 NOTE — Progress Notes (Signed)
Pt refused he night dose of insulin. The risks and benefits explained to pt and she verbalized understanding. Medical team is aware of pt's refusal for insulin

## 2022-03-16 NOTE — Plan of Care (Signed)
  Problem: Education: Goal: Ability to describe self-care measures that may prevent or decrease complications (Diabetes Survival Skills Education) will improve Outcome: Progressing Goal: Individualized Educational Video(s) Outcome: Progressing   Problem: Coping: Goal: Ability to adjust to condition or change in health will improve Outcome: Progressing   Problem: Fluid Volume: Goal: Ability to maintain a balanced intake and output will improve Outcome: Progressing   Problem: Health Behavior/Discharge Planning: Goal: Ability to identify and utilize available resources and services will improve Outcome: Progressing Goal: Ability to manage health-related needs will improve Outcome: Progressing   Problem: Metabolic: Goal: Ability to maintain appropriate glucose levels will improve Outcome: Progressing   Problem: Nutritional: Goal: Maintenance of adequate nutrition will improve Outcome: Progressing Goal: Progress toward achieving an optimal weight will improve Outcome: Progressing   Problem: Skin Integrity: Goal: Risk for impaired skin integrity will decrease Outcome: Progressing   Problem: Tissue Perfusion: Goal: Adequacy of tissue perfusion will improve Outcome: Progressing   Problem: Education: Goal: Ability to describe self-care measures that may prevent or decrease complications (Diabetes Survival Skills Education) will improve Outcome: Progressing Goal: Individualized Educational Video(s) Outcome: Progressing   Problem: Cardiac: Goal: Ability to maintain an adequate cardiac output will improve Outcome: Progressing   Problem: Health Behavior/Discharge Planning: Goal: Ability to identify and utilize available resources and services will improve Outcome: Progressing Goal: Ability to manage health-related needs will improve Outcome: Progressing   Problem: Fluid Volume: Goal: Ability to achieve a balanced intake and output will improve Outcome: Progressing    Problem: Health Behavior/Discharge Planning: Goal: Ability to manage health-related needs will improve Outcome: Progressing   Problem: Clinical Measurements: Goal: Ability to maintain clinical measurements within normal limits will improve Outcome: Progressing Goal: Will remain free from infection Outcome: Progressing Goal: Diagnostic test results will improve Outcome: Progressing Goal: Respiratory complications will improve Outcome: Progressing Goal: Cardiovascular complication will be avoided Outcome: Progressing   Problem: Safety: Goal: Ability to remain free from injury will improve Outcome: Progressing   Problem: Skin Integrity: Goal: Risk for impaired skin integrity will decrease Outcome: Progressing   Problem: Activity: Goal: Risk for activity intolerance will decrease Outcome: Progressing   Problem: Nutrition: Goal: Adequate nutrition will be maintained Outcome: Progressing   Problem: Elimination: Goal: Will not experience complications related to bowel motility Outcome: Progressing Goal: Will not experience complications related to urinary retention Outcome: Progressing   Problem: Pain Managment: Goal: General experience of comfort will improve Outcome: Progressing

## 2022-03-17 LAB — GLUCOSE, CAPILLARY: Glucose-Capillary: 125 mg/dL — ABNORMAL HIGH (ref 70–99)

## 2022-03-17 NOTE — Progress Notes (Signed)
Patient refused to check her finger stick blood sugar at 2200. Pt. Stated " I was supposed to be discharged today". I educated the patient of the importance of checking her blood sugar. She said she will worry about her blood sugar once she goes home.

## 2022-03-17 NOTE — Progress Notes (Signed)
Patient refused assessment. Patient provided discharge material and has home medications at bedside. Patient calling husband for him to pick her up.

## 2022-03-17 NOTE — TOC Transition Note (Signed)
Transition of Care [facility]) - CM/SW Discharge Note   Patient Details  Name: MARCUS GROLL MRN: [id number] Date of Birth: 12-Oct-1965  Transition of Care [facility]) CM/SW Contact:  Carles Collet, RN Phone Number: 03/17/2022, 8:29 AM   Clinical Narrative:     Spoke w patient, she declined walker for home use. Notified [facility] of DC today. No other TOC needs identified for DC  Final next level of care: [place] Barriers to Discharge: No Barriers Identified   Patient Goals and CMS Choice Patient states their goals for this hospitalization and ongoing recovery are:: To return home CMS Medicare.gov Compare Post Acute Care list provided to:: Patient Choice offered to / list presented to : Patient  Discharge Placement                       Discharge Plan and Services In-house Referral: PCP / Health Connect Discharge Planning Services: CM Consult, Follow-up appt scheduled            DME Arranged: N/A         HH Arranged: RN, PT, OT HH Agency: [place] Date [facility] Agency Contacted: 03/17/22 Time [place]: [phone] Representative spoke with at [place]: Amy  Social Determinants of Health ([place]) Interventions     Readmission Risk Interventions     No data to display

## 2022-03-17 NOTE — Progress Notes (Signed)
No charge Progress Note  Patient was supposed to discharge 03/16/2022.  I had completed all her discharge paperwork including her medication reconciliation, AVS and discharge summary.  Unfortunately, following that, the pharmacist added lancets and glucometer strips to her discharge orders which opened up the AVS and the RN was not able to print AVS.  I was not contacted until close to after hours.  It does not appear that the RN attempted to contact the night coverage providers to resolve this issue.  Patient thereby stayed overnight.  Completed the AVS this morning.  As per RN, no new or acute needs, no indication for patient to be seen in person and was discharged home.  Vernell Leep, MD,  FACP, [place], [facility], [facility], [facility] [place]   [facility] Advisor [place]     To contact the attending provider between 7A-7P or the covering provider during after hours 7P-7A, please log into the web site [email or web address] and access using universal [place] password for that web site. If you do not have the password, please call the hospital operator.

## 2022-03-23 ENCOUNTER — Inpatient Hospital Stay: Payer: 59 | Admitting: Nurse Practitioner

## 2022-05-01 ENCOUNTER — Inpatient Hospital Stay: Payer: 59 | Admitting: Nurse Practitioner

## 2022-05-14 ENCOUNTER — Inpatient Hospital Stay: Payer: No Typology Code available for payment source | Admitting: Nurse Practitioner

## 2022-05-23 ENCOUNTER — Encounter: Payer: Self-pay | Admitting: Nurse Practitioner

## 2022-05-23 ENCOUNTER — Ambulatory Visit (INDEPENDENT_AMBULATORY_CARE_PROVIDER_SITE_OTHER): Payer: No Typology Code available for payment source | Admitting: Nurse Practitioner

## 2022-05-23 VITALS — BP 137/93 | HR 126 | Temp 98.3°F | Ht 65.0 in | Wt 226.0 lb

## 2022-05-23 DIAGNOSIS — H538 Other visual disturbances: Secondary | ICD-10-CM | POA: Diagnosis not present

## 2022-05-23 DIAGNOSIS — E1165 Type 2 diabetes mellitus with hyperglycemia: Secondary | ICD-10-CM | POA: Diagnosis not present

## 2022-05-23 DIAGNOSIS — Z1211 Encounter for screening for malignant neoplasm of colon: Secondary | ICD-10-CM

## 2022-05-23 DIAGNOSIS — I10 Essential (primary) hypertension: Secondary | ICD-10-CM | POA: Diagnosis not present

## 2022-05-23 DIAGNOSIS — H269 Unspecified cataract: Secondary | ICD-10-CM | POA: Insufficient documentation

## 2022-05-23 DIAGNOSIS — Z1231 Encounter for screening mammogram for malignant neoplasm of breast: Secondary | ICD-10-CM | POA: Diagnosis not present

## 2022-05-23 LAB — POCT URINALYSIS DIPSTICK
Blood, UA: NEGATIVE
Glucose, UA: POSITIVE — AB
Ketones, UA: 80
Leukocytes, UA: NEGATIVE
Nitrite, UA: NEGATIVE
Protein, UA: POSITIVE — AB
Spec Grav, UA: >=1.03 — AB (ref 1.010–1.025)
Urobilinogen, UA: 0.2 E.U./dL
pH, UA: 5 (ref 5.0–8.0)

## 2022-05-23 LAB — POCT GLYCOSYLATED HEMOGLOBIN (HGB A1C): Hemoglobin A1C: 11 % — AB (ref 4.0–5.6)

## 2022-05-23 LAB — GLUCOSE, POCT (MANUAL RESULT ENTRY): POC Glucose: 349 mg/dl — AB (ref 70–99)

## 2022-05-23 NOTE — Progress Notes (Signed)
New Patient Office Visit  Subjective:  Patient ID: Patricia Schmitt, female    DOB: [birth year]  Age: 56 y.o. MRN: [id number]  CC:  Chief Complaint  Patient presents with   Hospitalization Follow-up    Pt stated--eye problem. Release to get surgery for the eye.    HPI Patricia Schmitt is a 56 y.o. female with past medical history of uncontrolled type 2 diabetes, hypertension, AKI who presents to establish care for her chronic medical conditions. And to get release to have cataract removal surgery. In October 2023 patient was admitted in the hospital for COVID-19 infection, AKI, accelerated hypertension.  She was newly diagnosed with type 2 diabetes.  She was found to have severe metabolic acidosis and hyperglycemia in DKA.  In the hospital she was started on insulin drip, after discontinuation of insulin drip she was transitioned to Levemir and SSI.  however at discharge patient refused medical treatment for her uncontrolled type 2 diabetes.   Patient continues to refuse medications for her diabetes, eating more Keto diet.  I discussed with the patient the lifestyle changes in of my not being sufficient to get her diabetes under control.  In the hospital she refused to take ACE inhibitors, currently has amlodipine 10 mg daily ordered but she states that she has not been taking the medications consistently.  She was referred to ENT while she was in the hospital but she said she did not follow-up with them.  She currently denies ear pain, drainage, hearing impairment.  Her otitis was thought to be viral related due to COVID-19   Hypertension.  She has amlodipine 50m  mg daily ordered but she has not been taking the medications consistently.  She denies chest pain, dizziness, edema.  She refused ACE inhibitor initiation while in the hospital.   Uncontrolled type 2 diabetes.  Currently not on medication.Today blood sugar is elevated at 349, A1c 11.0 ketones 80, glucose 500.  Patient denies confusion,  polyuria, polyphagia, polydipsia.  She states that she has been trying to manage her diabetes by lifestyle modifications.  States that she does not exercise regularly but she has noticed 30 pounds while on keto diet  Cataract.  She plans on having cataract removal surgery done but states that the procedure was canceled due to having COVID-19 infection in October.  She would like all released to have cataract removal surgery done today.   She has had total hysterectomy done in the past for heavy..   Mammogram ordered to screen for breast cancer  Cologuard ordered to screen for colon cancer.   Patient encouraged to get shingles vaccine at the pharmacy she declined flu vaccine today    Past Medical History:  Diagnosis Date   Diabetes mellitus without complication ([facility]    Prediabetes   Headache(784.0)    Migraine   Hypertension    Meningitis spinal    Obesity     Past Surgical History:  Procedure Laterality Date   ABDOMINAL HYSTERECTOMY  2007   [place]   History reviewed. No pertinent family history.  Social History   Socioeconomic History   Marital status: Married    Spouse name: Not on file   Number of children: 0   Years of education: college   Highest education level: Not on file  Occupational History   Occupation: SPress photographerRep    Employer: [employer] Tobacco Use   Smoking status: Never   Smokeless tobacco: Never  Vaping Use  Vaping Use: Never used  Substance and Sexual Activity   Alcohol use: Yes    Comment: Drink 1-2 times per year for holidays   Drug use: No   Sexual activity: Not on file  Other Topics Concern   Not on file  Social History Narrative   Lives with her husband.    Social Determinants of Health   Financial Resource Strain: Not on file  Food Insecurity: No Food Insecurity (03/12/2022)   Hunger Vital Sign    Worried About Running Out of Food in the Last Year: Never true    Ran Out of Food in the Last Year:  Never true  Transportation Needs: No Transportation Needs (03/12/2022)   PRAPARE - Hydrologist (Medical): No    Lack of Transportation (Non-Medical): No  Physical Activity: Not on file  Stress: Not on file  Social Connections: Not on file  Intimate Partner Violence: Not At Risk (03/12/2022)   Humiliation, Afraid, Rape, and Kick questionnaire    Fear of Current or Ex-Partner: No    Emotionally Abused: No    Physically Abused: No    Sexually Abused: No    ROS Review of Systems  Constitutional:  Negative for activity change, appetite change, chills, diaphoresis, fatigue, fever and unexpected weight change.  Eyes:  Positive for visual disturbance. Negative for redness.  Respiratory:  Negative for choking, chest tightness, shortness of breath, wheezing and stridor.   Cardiovascular:  Negative for chest pain, palpitations and leg swelling.  Gastrointestinal:  Negative for abdominal distention, abdominal pain and anal bleeding.  Endocrine: Negative for cold intolerance, heat intolerance, polydipsia, polyphagia and polyuria.  Skin: Negative.   Neurological:  Negative for dizziness, seizures, facial asymmetry, light-headedness, numbness and headaches.  Psychiatric/Behavioral: Negative.  Negative for agitation, behavioral problems, confusion, decreased concentration and dysphoric mood.     Objective:   Today's Vitals: BP (!) 137/93   Pulse (!) 126   Temp 98.3 F (36.8 C)   Ht _0  (1.651 m)   Wt 226 lb (102.5 kg)   SpO2 98%   BMI 37.61 kg/m   Physical Exam Constitutional:      General: She is not in acute distress.    Appearance: She is obese. She is not ill-appearing, toxic-appearing or diaphoretic.  Eyes:     General: No scleral icterus.    Extraocular Movements: Extraocular movements intact.     Conjunctiva/sclera: Conjunctivae normal.     Comments: Cataract on both eyes worse on the right eye  Cardiovascular:     Rate and Rhythm: Normal rate  and regular rhythm.     Pulses: Normal pulses.     Heart sounds: Normal heart sounds. No murmur heard.    No friction rub. No gallop.  Pulmonary:     Effort: Pulmonary effort is normal. No respiratory distress.     Breath sounds: Normal breath sounds. No stridor. No wheezing, rhonchi or rales.  Chest:     Chest wall: No tenderness.  Abdominal:     General: There is no distension.     Palpations: Abdomen is soft. There is no mass.     Tenderness: There is no abdominal tenderness. There is no guarding.  Musculoskeletal:     Left lower leg: No edema.  Skin:    General: Skin is warm and dry.  Neurological:     Mental Status: She is alert and oriented to person, place, and time.     Cranial Nerves: No cranial  nerve deficit.     Sensory: No sensory deficit.     Motor: No weakness.     Coordination: Coordination normal.     Gait: Gait normal.  Psychiatric:        Mood and Affect: Mood normal.        Behavior: Behavior normal.        Thought Content: Thought content normal.        Judgment: Judgment normal.     Assessment & Plan:   Problem List Items Addressed This Visit       Cardiovascular and Mediastinum   High blood pressure    BP Readings from Last 3 Encounters:  05/23/22 (!) 137/93  03/17/22 (!) 155/88  11/04/12 136/90    Hypertension.  She has amlodipine 15m  mg daily ordered but she has not been taking the medications consistently.  She denies chest pain, dizziness, edema.  She refused ACE inhibitor initiation while in the hospital.  Patient counseled on DASH diet she was encouraged to engage in regular daily moderate exercises at least 150 minutes weekly. Follow-up in 3 months. Blood pressure goal is less than 130/80        Endocrine   Uncontrolled type 2 diabetes mellitus with hyperglycemia ([facility]    Lab Results  Component Value Date   HGBA1C 11.0 (A) 05/23/2022    Currently not on medication.Today blood sugar is elevated at 349, A1c 11.0, ketones  80, glycosuria.  Patient denies confusion, polyuria, polyphagia, polydipsia.  She states that she has been trying to manage her diabetes by lifestyle modifications.  States that she does not exercise regularly but she has noticed 30 pounds while on keto diet. Need to get diabetes under control including risk of heart attack, kidney damage, DKA was discussed with patient she verbalized understanding.  She refused treatment for her elevated blood sugar today.  She was encouraged to drink at least 64 ounces of water daily seek urgent care if there is any decline in her health.  Urine creatinine albumin ratio obtained today We will do foot exam at next visit. Check lipid panel at next visit.        Relevant Orders   Urine microalbumin-creatinine with uACR   POCT glucose (manual entry) (Completed)   POCT glycosylated hemoglobin (Hb A1C) (Completed)   POCT urinalysis dipstick (Completed)     Other   Blurry vision, bilateral    Due to cataracts per patient.  She would like a release for her to have surgery for her cataract. Patient encouraged to start taking amlodipine 10 mg daily consistently to get her blood pressure under control, she continues to refuse medications for her uncontrolled diabetes.  Notes given to the patient today to proceed with her eye surgery if uncontrolled diabetes is not a contraindication for her eye procedure.  Plan discussed with patient she verbalized understanding      Class 2 severe obesity with serious comorbidity in adult ([facility]    Wt Readings from Last 3 Encounters:  05/23/22 226 lb (102.5 kg)  03/17/22 241 lb 2.9 oz (109.4 kg)  11/04/12 249 lb 9.6 oz (113.2 kg)  She has lost 65 since last weight check. Patient counseled on low-carb modified diet she was encouraged to engage in regular moderate exercises at least 150 minutes weekly.      Other Visit Diagnoses     Encounter for screening mammogram for malignant neoplasm of breast       Relevant Orders   MM  Digital Screening   Screening for colon cancer       Relevant Orders   Cologuard       Outpatient Encounter Medications as of 05/23/2022  Medication Sig   Accu-Chek Softclix Lancets lancets Use as directed up to four times daily.   acetaminophen (TYLENOL) 325 MG tablet Take 2 tablets (650 mg total) by mouth every 6 (six) hours as needed for mild pain, moderate pain, fever or headache.   amLODipine (NORVASC) 10 MG tablet Take 1 tablet (10 mg total) by mouth daily.   blood glucose meter kit and supplies Use up to four times daily as directed   glucose blood test strip Use as directed up to four times daily,   OVER THE COUNTER MEDICATION Take 3 capsules by mouth daily. Balance of Nature Fruits   OVER THE COUNTER MEDICATION Take 3 capsules by mouth daily. Balance of [employer] Veggies   No facility-administered encounter medications on file as of 05/23/2022.    Follow-up: Return in about 3 months (around 08/22/2022).   Renee Rival, FNP

## 2022-05-23 NOTE — Progress Notes (Signed)
2/83 

## 2022-05-23 NOTE — Patient Instructions (Addendum)
Please start taking amlodipine 10 mg daily as dicussed.  Call [facility] breast center to schedule your mammogram [phone].  It is important that you exercise regularly at least 30 minutes 5 times a week as tolerated  Think about what you will eat, plan ahead. Choose " clean, green, fresh or frozen" over canned, processed or packaged foods which are more sugary, salty and fatty. 70 to 75% of food eaten should be vegetables and fruit. Three meals at set times with snacks allowed between meals, but they must be fruit or vegetables. Aim to eat over a 12 hour period , example 7 am to 7 pm, and STOP after  your last meal of the day. Drink water,generally about 64 ounces per day, no other drink is as healthy. Fruit juice is best enjoyed in a healthy way, by EATING the fruit.  Thanks for choosing [facility] we consider it a privelige to serve you.

## 2022-05-23 NOTE — Assessment & Plan Note (Addendum)
Due to cataracts per patient.  She would like a release for her to have surgery for her cataract. Patient encouraged to start taking amlodipine 10 mg daily consistently to get her blood pressure under control, she continues to refuse medications for her uncontrolled diabetes.  Notes given to the patient today to proceed with her eye surgery if uncontrolled diabetes is not a contraindication for her eye procedure.  Plan discussed with patient she verbalized understanding

## 2022-05-23 NOTE — Assessment & Plan Note (Signed)
BP Readings from Last 3 Encounters:  05/23/22 (!) 137/93  03/17/22 (!) 155/88  11/04/12 136/90    Hypertension.  She has amlodipine 10mg   mg daily ordered but she has not been taking the medications consistently.  She denies chest pain, dizziness, edema.  She refused ACE inhibitor initiation while in the hospital.  Patient counseled on DASH diet she was encouraged to engage in regular daily moderate exercises at least 150 minutes weekly. Follow-up in 3 months. Blood pressure goal is less than 130/80

## 2022-05-23 NOTE — Assessment & Plan Note (Addendum)
Lab Results  Component Value Date   HGBA1C 11.0 (A) 05/23/2022    Currently not on medication.Today blood sugar is elevated at 349, A1c 11.0, ketones 80, glycosuria.  Patient denies confusion, polyuria, polyphagia, polydipsia.  She states that she has been trying to manage her diabetes by lifestyle modifications.  States that she does not exercise regularly but she has noticed 30 pounds while on keto diet. Need to get diabetes under control including risk of heart attack, kidney damage, DKA was discussed with patient she verbalized understanding.  She refused treatment for her elevated blood sugar today.  She was encouraged to drink at least 64 ounces of water daily seek urgent care if there is any decline in her health.  Urine creatinine albumin ratio obtained today We will do foot exam at next visit. Check lipid panel at next visit.

## 2022-05-23 NOTE — Assessment & Plan Note (Signed)
Wt Readings from Last 3 Encounters:  05/23/22 226 lb (102.5 kg)  03/17/22 241 lb 2.9 oz (109.4 kg)  11/04/12 249 lb 9.6 oz (113.2 kg)  She has lost 65 since last weight check. Patient counseled on low-carb modified diet she was encouraged to engage in regular moderate exercises at least 150 minutes weekly.

## 2022-05-24 LAB — MICROALBUMIN / CREATININE URINE RATIO
Creatinine, Urine: 128.1 mg/dL
Microalb/Creat Ratio: 103 mg/g creat — ABNORMAL HIGH (ref 0–29)
Microalbumin, Urine: 131.6 ug/mL

## 2022-05-25 NOTE — Progress Notes (Signed)
Patient is leaking protein in her urine, this is abnormal and indicates kidney damage. it is very important that we get her diabetes under control to avoid further damage to her kidneys.

## 2022-07-27 ENCOUNTER — Other Ambulatory Visit (HOSPITAL_COMMUNITY): Payer: Self-pay

## 2022-08-22 ENCOUNTER — Ambulatory Visit: Payer: Self-pay | Admitting: Nurse Practitioner

## 2023-12-13 ENCOUNTER — Encounter: Payer: Self-pay | Admitting: Advanced Practice Midwife

## 2024-02-19 ENCOUNTER — Other Ambulatory Visit (HOSPITAL_COMMUNITY): Payer: Self-pay

## 2024-06-05 ENCOUNTER — Other Ambulatory Visit: Payer: Self-pay
